# Patient Record
Sex: Male | Born: 1960
Health system: Southern US, Community
[De-identification: ages and names within clinical notes are randomized; demographics above are authoritative.]

## PROBLEM LIST (undated history)

## (undated) DIAGNOSIS — J45909 Unspecified asthma, uncomplicated: Secondary | ICD-10-CM

## (undated) HISTORY — PX: HIP SURGERY: SHX245

---

## 1998-12-25 ENCOUNTER — Emergency Department (HOSPITAL_COMMUNITY): Admission: EM | Admit: 1998-12-25 | Discharge: 1998-12-25 | Payer: Self-pay | Admitting: Emergency Medicine

## 1999-07-24 ENCOUNTER — Emergency Department (HOSPITAL_COMMUNITY): Admission: EM | Admit: 1999-07-24 | Discharge: 1999-07-24 | Payer: Self-pay | Admitting: *Deleted

## 1999-07-25 ENCOUNTER — Encounter: Payer: Self-pay | Admitting: Emergency Medicine

## 2000-04-27 ENCOUNTER — Encounter: Payer: Self-pay | Admitting: Emergency Medicine

## 2000-04-27 ENCOUNTER — Encounter: Payer: Self-pay | Admitting: Orthopaedic Surgery

## 2000-04-27 ENCOUNTER — Inpatient Hospital Stay (HOSPITAL_COMMUNITY): Admission: EM | Admit: 2000-04-27 | Discharge: 2000-05-06 | Payer: Self-pay | Admitting: Emergency Medicine

## 2000-04-28 ENCOUNTER — Encounter: Payer: Self-pay | Admitting: Orthopedic Surgery

## 2000-04-29 ENCOUNTER — Encounter: Payer: Self-pay | Admitting: Orthopedic Surgery

## 2000-05-02 ENCOUNTER — Encounter: Payer: Self-pay | Admitting: Orthopedic Surgery

## 2000-05-04 ENCOUNTER — Encounter: Payer: Self-pay | Admitting: Orthopedic Surgery

## 2000-05-09 ENCOUNTER — Emergency Department (HOSPITAL_COMMUNITY): Admission: EM | Admit: 2000-05-09 | Discharge: 2000-05-09 | Payer: Self-pay | Admitting: Emergency Medicine

## 2001-12-17 ENCOUNTER — Encounter: Payer: Self-pay | Admitting: *Deleted

## 2001-12-17 ENCOUNTER — Emergency Department (HOSPITAL_COMMUNITY): Admission: EM | Admit: 2001-12-17 | Discharge: 2001-12-17 | Payer: Self-pay | Admitting: *Deleted

## 2002-01-19 ENCOUNTER — Emergency Department (HOSPITAL_COMMUNITY): Admission: EM | Admit: 2002-01-19 | Discharge: 2002-01-19 | Payer: Self-pay | Admitting: Emergency Medicine

## 2002-01-19 ENCOUNTER — Ambulatory Visit (HOSPITAL_COMMUNITY): Admission: RE | Admit: 2002-01-19 | Discharge: 2002-01-19 | Payer: Self-pay | Admitting: Emergency Medicine

## 2002-01-19 ENCOUNTER — Encounter: Payer: Self-pay | Admitting: Emergency Medicine

## 2002-01-23 ENCOUNTER — Emergency Department (HOSPITAL_COMMUNITY): Admission: EM | Admit: 2002-01-23 | Discharge: 2002-01-23 | Payer: Self-pay | Admitting: *Deleted

## 2002-04-11 ENCOUNTER — Encounter: Payer: Self-pay | Admitting: Emergency Medicine

## 2002-04-11 ENCOUNTER — Emergency Department (HOSPITAL_COMMUNITY): Admission: EM | Admit: 2002-04-11 | Discharge: 2002-04-12 | Payer: Self-pay | Admitting: Emergency Medicine

## 2002-08-14 ENCOUNTER — Emergency Department (HOSPITAL_COMMUNITY): Admission: EM | Admit: 2002-08-14 | Discharge: 2002-08-15 | Payer: Self-pay | Admitting: Emergency Medicine

## 2002-08-15 ENCOUNTER — Encounter: Payer: Self-pay | Admitting: Emergency Medicine

## 2002-08-25 ENCOUNTER — Ambulatory Visit (HOSPITAL_COMMUNITY): Admission: RE | Admit: 2002-08-25 | Discharge: 2002-08-25 | Payer: Self-pay | Admitting: Internal Medicine

## 2003-06-28 ENCOUNTER — Emergency Department (HOSPITAL_COMMUNITY): Admission: EM | Admit: 2003-06-28 | Discharge: 2003-06-28 | Payer: Self-pay | Admitting: Emergency Medicine

## 2003-07-01 ENCOUNTER — Emergency Department (HOSPITAL_COMMUNITY): Admission: EM | Admit: 2003-07-01 | Discharge: 2003-07-02 | Payer: Self-pay | Admitting: Emergency Medicine

## 2005-07-18 ENCOUNTER — Emergency Department (HOSPITAL_COMMUNITY): Admission: EM | Admit: 2005-07-18 | Discharge: 2005-07-18 | Payer: Self-pay | Admitting: Emergency Medicine

## 2008-08-28 ENCOUNTER — Observation Stay (HOSPITAL_COMMUNITY): Admission: AC | Admit: 2008-08-28 | Discharge: 2008-08-29 | Payer: Self-pay

## 2009-01-10 ENCOUNTER — Emergency Department (HOSPITAL_COMMUNITY): Admission: EM | Admit: 2009-01-10 | Discharge: 2009-01-10 | Payer: Self-pay | Admitting: Emergency Medicine

## 2009-10-03 ENCOUNTER — Emergency Department (HOSPITAL_COMMUNITY): Admission: EM | Admit: 2009-10-03 | Discharge: 2009-10-03 | Payer: Self-pay | Admitting: Emergency Medicine

## 2009-10-06 ENCOUNTER — Emergency Department (HOSPITAL_COMMUNITY): Admission: EM | Admit: 2009-10-06 | Discharge: 2009-10-06 | Payer: Self-pay | Admitting: Emergency Medicine

## 2010-06-11 ENCOUNTER — Emergency Department (HOSPITAL_COMMUNITY)
Admission: EM | Admit: 2010-06-11 | Discharge: 2010-06-11 | Payer: Medicare Other | Source: Home / Self Care | Admitting: Family Medicine

## 2010-06-18 ENCOUNTER — Emergency Department (HOSPITAL_COMMUNITY)
Admission: EM | Admit: 2010-06-18 | Discharge: 2010-06-18 | Payer: Self-pay | Source: Home / Self Care | Admitting: Emergency Medicine

## 2010-06-28 ENCOUNTER — Emergency Department (HOSPITAL_COMMUNITY): Payer: Medicare Other

## 2010-06-28 ENCOUNTER — Emergency Department (HOSPITAL_COMMUNITY)
Admission: EM | Admit: 2010-06-28 | Discharge: 2010-06-28 | Disposition: A | Discharge status: Home / Self Care | Payer: Medicare Other | Attending: Emergency Medicine | Admitting: Emergency Medicine

## 2010-06-28 DIAGNOSIS — M79609 Pain in unspecified limb: Secondary | ICD-10-CM | POA: Insufficient documentation

## 2010-06-28 DIAGNOSIS — W268XXA Contact with other sharp object(s), not elsewhere classified, initial encounter: Secondary | ICD-10-CM | POA: Insufficient documentation

## 2010-06-28 DIAGNOSIS — M7989 Other specified soft tissue disorders: Secondary | ICD-10-CM | POA: Insufficient documentation

## 2010-06-28 DIAGNOSIS — S91309A Unspecified open wound, unspecified foot, initial encounter: Secondary | ICD-10-CM | POA: Insufficient documentation

## 2010-06-28 DIAGNOSIS — Y929 Unspecified place or not applicable: Secondary | ICD-10-CM | POA: Insufficient documentation

## 2010-09-05 LAB — CBC
Hemoglobin: 13.7 g/dL (ref 13.0–17.0)
RBC: 4.65 MIL/uL (ref 4.22–5.81)
RDW: 13.6 % (ref 11.5–15.5)

## 2010-09-05 LAB — POCT I-STAT, CHEM 8
BUN: 13 mg/dL (ref 6–23)
Creatinine, Ser: 1.6 mg/dL — ABNORMAL HIGH (ref 0.4–1.5)
Glucose, Bld: 125 mg/dL — ABNORMAL HIGH (ref 70–99)
HCT: 43 % (ref 39.0–52.0)
Hemoglobin: 14.6 g/dL (ref 13.0–17.0)
Sodium: 141 meq/L (ref 135–145)
TCO2: 23 mmol/L (ref 0–100)

## 2010-09-05 LAB — SAMPLE TO BLOOD BANK

## 2010-09-05 LAB — PROTIME-INR: Prothrombin Time: 12.5 seconds (ref 11.6–15.2)
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
## 2010-11-09 ENCOUNTER — Emergency Department (HOSPITAL_COMMUNITY): Payer: Medicare Other

## 2010-11-09 ENCOUNTER — Emergency Department (HOSPITAL_COMMUNITY)
Admission: EM | Admit: 2010-11-09 | Discharge: 2010-11-09 | Disposition: A | Discharge status: Home / Self Care | Payer: Medicare Other | Attending: Emergency Medicine | Admitting: Emergency Medicine

## 2010-11-09 DIAGNOSIS — M25559 Pain in unspecified hip: Secondary | ICD-10-CM | POA: Insufficient documentation

## 2010-11-09 DIAGNOSIS — J45909 Unspecified asthma, uncomplicated: Secondary | ICD-10-CM | POA: Insufficient documentation

## 2010-11-09 DIAGNOSIS — W19XXXA Unspecified fall, initial encounter: Secondary | ICD-10-CM | POA: Insufficient documentation

## 2010-11-09 DIAGNOSIS — M25569 Pain in unspecified knee: Secondary | ICD-10-CM | POA: Insufficient documentation

## 2011-09-05 ENCOUNTER — Emergency Department (HOSPITAL_COMMUNITY)
Admission: EM | Admit: 2011-09-05 | Discharge: 2011-09-05 | Disposition: A | Discharge status: Home / Self Care | Payer: Medicare Other | Attending: Emergency Medicine | Admitting: Emergency Medicine

## 2011-09-05 ENCOUNTER — Encounter (HOSPITAL_COMMUNITY): Payer: Self-pay | Admitting: Nurse Practitioner

## 2011-09-05 DIAGNOSIS — L02619 Cutaneous abscess of unspecified foot: Secondary | ICD-10-CM | POA: Insufficient documentation

## 2011-09-05 DIAGNOSIS — L03119 Cellulitis of unspecified part of limb: Secondary | ICD-10-CM

## 2011-09-05 MED ORDER — CEPHALEXIN 500 MG PO CAPS: 500.0000 mg | ORAL_CAPSULE | Freq: Four times a day (QID) | ORAL | Status: AC

## 2011-09-05 NOTE — ED Provider Notes (Signed)
History     CSN: [id number]  Arrival date & time 09/05/11  8657   First MD Initiated Contact with Patient 09/05/11 1006      Chief Complaint  Patient presents with  . Foot Pain    (Consider location/radiation/quality/duration/timing/severity/associated sxs/prior treatment) Patient is a 51 y.o. male presenting with lower extremity pain. The history is provided by the patient.  Foot Pain This is a new problem. The current episode started 2 days ago. The problem occurs constantly. The problem has been gradually worsening. The symptoms are aggravated by walking and standing. The symptoms are relieved by nothing. He has tried nothing for the symptoms.    History reviewed. No pertinent past medical history.  Past Surgical History  Procedure Date  . Hip surgery     History reviewed. No pertinent family history.  History  Substance Use Topics  . Smoking status: Never Smoker   . Smokeless tobacco: Not on file  . Alcohol Use: Yes     social      Review of Systems  All other systems reviewed and are negative.    Allergies  Review of patient's allergies indicates no known allergies.  Home Medications  No current outpatient prescriptions on file.  BP 143/91  Pulse 66  Temp(Src) 97.4 F (36.3 C) (Oral)  Resp 18  Ht 5\' 3"  (1.6 m)  Wt 260 lb (117.935 kg)  BMI 46.06 kg/m2  SpO2 98%  Physical Exam  Nursing note and vitals reviewed. Constitutional: He is oriented to person, place, and time. He appears well-developed and well-nourished. No distress.  HENT:  Head: Normocephalic and atraumatic.  Neck: Normal range of motion. Neck supple.  Musculoskeletal: Normal range of motion.  Neurological: He is alert and oriented to person, place, and time.  Skin: Skin is warm and dry. He is not diaphoretic.       There is swelling, warmth, and erythema to the dorsum of the left foot.  No ankle edema or calf ttp.  Homan's sign is absent.    ED Course  Procedures (including  critical care time)  Labs Reviewed - No data to display No results found.   No diagnosis found.    MDM  Clearly cellulitis.  No concern for dvt.        Geoffery Lyons, MD 09/05/11 1014

## 2011-09-05 NOTE — ED Notes (Signed)
C/o L foot pain and swelling over past couple days. Denies any injuries. Cms intact

## 2011-09-05 NOTE — Discharge Instructions (Signed)

## 2012-06-09 ENCOUNTER — Emergency Department (HOSPITAL_COMMUNITY): Payer: Medicare Other

## 2012-06-09 ENCOUNTER — Encounter (HOSPITAL_COMMUNITY): Payer: Self-pay | Admitting: *Deleted

## 2012-06-09 ENCOUNTER — Emergency Department (HOSPITAL_COMMUNITY)
Admission: EM | Admit: 2012-06-09 | Discharge: 2012-06-09 | Disposition: A | Discharge status: Home / Self Care | Payer: Medicare Other | Attending: Emergency Medicine | Admitting: Emergency Medicine

## 2012-06-09 DIAGNOSIS — Z9889 Other specified postprocedural states: Secondary | ICD-10-CM | POA: Insufficient documentation

## 2012-06-09 DIAGNOSIS — S4980XA Other specified injuries of shoulder and upper arm, unspecified arm, initial encounter: Secondary | ICD-10-CM | POA: Insufficient documentation

## 2012-06-09 DIAGNOSIS — Y929 Unspecified place or not applicable: Secondary | ICD-10-CM | POA: Insufficient documentation

## 2012-06-09 DIAGNOSIS — S79929A Unspecified injury of unspecified thigh, initial encounter: Secondary | ICD-10-CM | POA: Insufficient documentation

## 2012-06-09 DIAGNOSIS — M25551 Pain in right hip: Secondary | ICD-10-CM

## 2012-06-09 DIAGNOSIS — S79919A Unspecified injury of unspecified hip, initial encounter: Secondary | ICD-10-CM | POA: Insufficient documentation

## 2012-06-09 DIAGNOSIS — Z79899 Other long term (current) drug therapy: Secondary | ICD-10-CM | POA: Insufficient documentation

## 2012-06-09 DIAGNOSIS — S46909A Unspecified injury of unspecified muscle, fascia and tendon at shoulder and upper arm level, unspecified arm, initial encounter: Secondary | ICD-10-CM | POA: Insufficient documentation

## 2012-06-09 DIAGNOSIS — W19XXXA Unspecified fall, initial encounter: Secondary | ICD-10-CM

## 2012-06-09 DIAGNOSIS — M25511 Pain in right shoulder: Secondary | ICD-10-CM

## 2012-06-09 DIAGNOSIS — Y9389 Activity, other specified: Secondary | ICD-10-CM | POA: Insufficient documentation

## 2012-06-09 DIAGNOSIS — W010XXA Fall on same level from slipping, tripping and stumbling without subsequent striking against object, initial encounter: Secondary | ICD-10-CM | POA: Insufficient documentation

## 2012-06-09 MED ORDER — NAPROXEN 500 MG PO TABS: 500.0000 mg | ORAL_TABLET | Freq: Two times a day (BID) | ORAL | Status: DC

## 2012-06-09 MED ORDER — CYCLOBENZAPRINE HCL 10 MG PO TABS: 10.0000 mg | ORAL_TABLET | Freq: Two times a day (BID) | ORAL | Status: DC | PRN

## 2012-06-09 MED ORDER — OXYCODONE-ACETAMINOPHEN 5-325 MG PO TABS: 1.0000 | ORAL_TABLET | ORAL | Status: DC | PRN

## 2012-06-09 MED ORDER — OXYCODONE-ACETAMINOPHEN 5-325 MG PO TABS
1.0000 | ORAL_TABLET | Freq: Once | ORAL | Status: AC
  Administered 2012-06-09: 1 via ORAL
  Filled 2012-06-09: qty 1

## 2012-06-09 NOTE — ED Provider Notes (Signed)
History     CSN: [id number]  Arrival date & time 06/09/12  0917   First MD Initiated Contact with Patient 06/09/12 1201      Chief Complaint  Patient presents with  . Shoulder Pain    right  . Fall    (Consider location/radiation/quality/duration/timing/severity/associated sxs/prior treatment) HPI [name] is a 52 y.o. male who presents with complaint of a fall yesterday. States tripped and landed onto right side. Reports pain to the right shoulder and right hip. Unable to move right arm. States hx of surgery to the right hip. Denies any other injuries. No head injury. Took a vicodin he had at home with no relief. States able to walk, but with pain. No numbness or weakness of extremities.  No other complaints.    History reviewed. No pertinent past medical history.  Past Surgical History  Procedure Date  . Hip surgery     History reviewed. No pertinent family history.  History  Substance Use Topics  . Smoking status: Never Smoker   . Smokeless tobacco: Never Used  . Alcohol Use: Yes     Comment: social      Review of Systems  Constitutional: Negative for fever and chills.  Respiratory: Negative.   Cardiovascular: Negative.   Gastrointestinal: Negative.   Musculoskeletal: Positive for myalgias, back pain, joint swelling and arthralgias.  Neurological: Negative for weakness and numbness.    Allergies  Review of patient's allergies indicates no known allergies.  Home Medications   Current Outpatient Rx  Name  Route  Sig  Dispense  Refill  . HYDROCODONE-ACETAMINOPHEN 5-500 MG PO TABS   Oral   Take 1 tablet by mouth every 6 (six) hours as needed. Pain         . CYCLOBENZAPRINE HCL 10 MG PO TABS   Oral   Take 1 tablet (10 mg total) by mouth 2 (two) times daily as needed for muscle spasms.   20 tablet   0   . NAPROXEN 500 MG PO TABS   Oral   Take 1 tablet (500 mg total) by mouth 2 (two) times daily.   30 tablet   0   .  OXYCODONE-ACETAMINOPHEN 5-325 MG PO TABS   Oral   Take 1 tablet by mouth every 4 (four) hours as needed for pain.   20 tablet   0     BP 138/74  Physical Exam  Nursing note and vitals reviewed. Constitutional: He is oriented to person, place, and time. He appears well-developed and well-nourished.  Eyes: Conjunctivae normal are normal.  Neck: Neck supple.  Cardiovascular: Normal rate, regular rhythm and normal heart sounds.   Pulmonary/Chest: Effort normal and breath sounds normal. No respiratory distress. He has no wheezes. He has no rales.  Abdominal: Soft. Bowel sounds are normal. He exhibits no distension. There is no tenderness. There is no rebound.  Musculoskeletal: He exhibits no edema.       Right shoulder normal appearing. Pain with any ROM. Tender over anterior superior and posterior joint. Radial pulses normal. Elbow normal. Right hip normal appearing. Pain with ROM  Of the hip, with flexion, internal and external rotation. Normal knee exam.   Neurological: He is alert and oriented to person, place, and time.    ED Course  Procedures (including critical care time)  Labs Reviewed - No data to display Dg Shoulder Right  06/09/2012  *RADIOLOGY REPORT*  Clinical Data: Shoulder pain, fall.  RIGHT SHOULDER - 2+ VIEW  Comparison: None.  Findings: Mild degenerative changes in the right AC joint. Glenohumeral joint is unremarkable. No acute bony abnormality. Specifically, no fracture, subluxation, or dislocation.  Soft tissues are intact.  IMPRESSION: No acute bony abnormality.   Original Report Authenticated By: Charlett Nose, M.D.    Dg Hip Complete Right  06/09/2012  *RADIOLOGY REPORT*  Clinical Data: Fall, right-sided pain.  RIGHT HIP - COMPLETE 2+ VIEW  Comparison: 11/09/2010  Findings: Postsurgical changes in the right acetabulum / hemipelvis.  Degenerative changes in the hips bilaterally with joint space narrowing and spurring, right greater than left.  No acute fracture,  subluxation or dislocation.  No change since prior study.  IMPRESSION: Postoperative changes on the right.  Degenerative changes in the hips bilaterally, right greater than left.  No acute findings.   Original Report Authenticated By: Charlett Nose, M.D.      1. Right shoulder pain   2. Right hip pain   3. Fall       MDM  Pt after mechanical fall yesterday. He is neurovascularly intact. His x-rays are negative. He was given a sling for right shoulder pain. Treated with percocet in ED. D/c home with pain medications and follow up with his orthopedist who is Dr. Lajoyce Corners.          Lottie Mussel, PA 06/09/12 1629  Lottie Mussel, PA 06/13/12 1557

## 2012-06-09 NOTE — ED Notes (Signed)
Patient transported to X-ray 

## 2012-06-09 NOTE — ED Notes (Signed)
Pt brought in by [facility] with reports of right shoulder pain that has worsened since falling yesterday.

## 2012-06-13 NOTE — ED Provider Notes (Signed)
Medical screening examination/treatment/procedure(s) were performed by non-physician practitioner and as supervising physician I was immediately available for consultation/collaboration.  Louay Myrie R. Yitzchak Kothari, MD 06/13/12 2313 

## 2012-07-21 ENCOUNTER — Emergency Department (HOSPITAL_COMMUNITY): Payer: Medicare Other

## 2012-07-21 ENCOUNTER — Encounter (HOSPITAL_COMMUNITY): Payer: Self-pay | Admitting: *Deleted

## 2012-07-21 ENCOUNTER — Emergency Department (HOSPITAL_COMMUNITY)
Admission: EM | Admit: 2012-07-21 | Discharge: 2012-07-21 | Disposition: A | Discharge status: Home / Self Care | Payer: Medicare Other | Attending: Emergency Medicine | Admitting: Emergency Medicine

## 2012-07-21 DIAGNOSIS — S79919A Unspecified injury of unspecified hip, initial encounter: Secondary | ICD-10-CM | POA: Insufficient documentation

## 2012-07-21 DIAGNOSIS — S7000XA Contusion of unspecified hip, initial encounter: Secondary | ICD-10-CM | POA: Insufficient documentation

## 2012-07-21 DIAGNOSIS — J45909 Unspecified asthma, uncomplicated: Secondary | ICD-10-CM | POA: Insufficient documentation

## 2012-07-21 DIAGNOSIS — Y9389 Activity, other specified: Secondary | ICD-10-CM | POA: Insufficient documentation

## 2012-07-21 DIAGNOSIS — Y92410 Unspecified street and highway as the place of occurrence of the external cause: Secondary | ICD-10-CM | POA: Insufficient documentation

## 2012-07-21 DIAGNOSIS — T148XXA Other injury of unspecified body region, initial encounter: Secondary | ICD-10-CM

## 2012-07-21 HISTORY — DX: Unspecified asthma, uncomplicated: J45.909

## 2012-07-21 MED ORDER — OXYCODONE-ACETAMINOPHEN 5-325 MG PO TABS: 1.0000 | ORAL_TABLET | Freq: Four times a day (QID) | ORAL | Status: DC | PRN

## 2012-07-21 MED ORDER — OXYCODONE-ACETAMINOPHEN 5-325 MG PO TABS
1.0000 | ORAL_TABLET | Freq: Once | ORAL | Status: AC
  Administered 2012-07-21: 1 via ORAL
  Filled 2012-07-21: qty 1

## 2012-07-21 NOTE — ED Notes (Signed)
Attempted to ambulate pt.  Pt st's he's unable to get up; gave pt percocet and ice pack, will re-attempt shortly.
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
## 2012-08-14 ENCOUNTER — Emergency Department (HOSPITAL_COMMUNITY)
Admission: EM | Admit: 2012-08-14 | Discharge: 2012-08-14 | Disposition: A | Discharge status: Home / Self Care | Payer: No Typology Code available for payment source | Attending: Emergency Medicine | Admitting: Emergency Medicine

## 2012-08-14 ENCOUNTER — Encounter (HOSPITAL_COMMUNITY): Payer: Self-pay | Admitting: Emergency Medicine

## 2012-08-14 DIAGNOSIS — Y9301 Activity, walking, marching and hiking: Secondary | ICD-10-CM | POA: Insufficient documentation

## 2012-08-14 DIAGNOSIS — J45909 Unspecified asthma, uncomplicated: Secondary | ICD-10-CM | POA: Insufficient documentation

## 2012-08-14 DIAGNOSIS — S79919A Unspecified injury of unspecified hip, initial encounter: Secondary | ICD-10-CM | POA: Diagnosis present

## 2012-08-14 DIAGNOSIS — S79921A Unspecified injury of right thigh, initial encounter: Secondary | ICD-10-CM

## 2012-08-14 DIAGNOSIS — Y92410 Unspecified street and highway as the place of occurrence of the external cause: Secondary | ICD-10-CM | POA: Insufficient documentation

## 2012-08-14 MED ORDER — ACETAMINOPHEN 500 MG PO TABS: 500.0000 mg | ORAL_TABLET | Freq: Four times a day (QID) | ORAL | Status: DC | PRN

## 2012-08-14 MED ORDER — ACETAMINOPHEN 325 MG PO TABS
650.0000 mg | ORAL_TABLET | Freq: Once | ORAL | Status: AC
  Administered 2012-08-14: 650 mg via ORAL
  Filled 2012-08-14: qty 2

## 2012-08-14 NOTE — ED Provider Notes (Signed)
Medical screening examination/treatment/procedure(s) were performed by non-physician practitioner and as supervising physician I was immediately available for consultation/collaboration.  Merlina Marchena M Chyrel Taha, MD 08/14/12 0746 
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
## 2012-11-12 ENCOUNTER — Ambulatory Visit: Payer: Medicare Other | Attending: Orthopedic Surgery | Admitting: Physical Therapy

## 2012-11-12 DIAGNOSIS — R262 Difficulty in walking, not elsewhere classified: Secondary | ICD-10-CM | POA: Insufficient documentation

## 2012-11-12 DIAGNOSIS — M25559 Pain in unspecified hip: Secondary | ICD-10-CM | POA: Insufficient documentation

## 2012-11-12 DIAGNOSIS — IMO0001 Reserved for inherently not codable concepts without codable children: Secondary | ICD-10-CM | POA: Insufficient documentation

## 2012-11-12 DIAGNOSIS — R269 Unspecified abnormalities of gait and mobility: Secondary | ICD-10-CM | POA: Insufficient documentation

## 2012-11-12 DIAGNOSIS — R609 Edema, unspecified: Secondary | ICD-10-CM | POA: Insufficient documentation

## 2012-11-24 ENCOUNTER — Ambulatory Visit: Payer: Medicare Other | Attending: Orthopedic Surgery | Admitting: Physical Therapy

## 2012-11-24 DIAGNOSIS — R609 Edema, unspecified: Secondary | ICD-10-CM | POA: Insufficient documentation

## 2012-11-24 DIAGNOSIS — IMO0001 Reserved for inherently not codable concepts without codable children: Secondary | ICD-10-CM | POA: Insufficient documentation

## 2012-11-24 DIAGNOSIS — M25559 Pain in unspecified hip: Secondary | ICD-10-CM | POA: Insufficient documentation

## 2012-11-24 DIAGNOSIS — R269 Unspecified abnormalities of gait and mobility: Secondary | ICD-10-CM | POA: Insufficient documentation

## 2012-11-24 DIAGNOSIS — R262 Difficulty in walking, not elsewhere classified: Secondary | ICD-10-CM | POA: Insufficient documentation

## 2012-11-26 ENCOUNTER — Ambulatory Visit: Payer: Medicare Other | Admitting: Physical Therapy

## 2012-12-01 ENCOUNTER — Ambulatory Visit: Payer: Medicare Other | Admitting: Physical Therapy

## 2012-12-03 ENCOUNTER — Ambulatory Visit: Payer: Medicare Other | Admitting: Physical Therapy

## 2012-12-08 ENCOUNTER — Ambulatory Visit: Payer: Medicare Other | Admitting: Physical Therapy

## 2012-12-14 ENCOUNTER — Ambulatory Visit: Payer: Medicare Other | Admitting: Physical Therapy

## 2012-12-17 ENCOUNTER — Ambulatory Visit: Payer: Medicare Other | Admitting: Physical Therapy

## 2012-12-23 ENCOUNTER — Ambulatory Visit: Payer: Medicare Other | Admitting: Physical Therapy

## 2012-12-29 ENCOUNTER — Ambulatory Visit: Payer: Medicare Other | Attending: Orthopedic Surgery | Admitting: Physical Therapy

## 2012-12-29 DIAGNOSIS — R269 Unspecified abnormalities of gait and mobility: Secondary | ICD-10-CM | POA: Insufficient documentation

## 2012-12-29 DIAGNOSIS — IMO0001 Reserved for inherently not codable concepts without codable children: Secondary | ICD-10-CM | POA: Insufficient documentation

## 2012-12-29 DIAGNOSIS — R609 Edema, unspecified: Secondary | ICD-10-CM | POA: Insufficient documentation

## 2012-12-29 DIAGNOSIS — M25559 Pain in unspecified hip: Secondary | ICD-10-CM | POA: Insufficient documentation

## 2012-12-29 DIAGNOSIS — R262 Difficulty in walking, not elsewhere classified: Secondary | ICD-10-CM | POA: Insufficient documentation

## 2012-12-31 ENCOUNTER — Ambulatory Visit: Payer: Medicare Other | Admitting: Physical Therapy

## 2013-01-04 ENCOUNTER — Ambulatory Visit: Payer: Medicare Other | Admitting: Physical Therapy

## 2013-01-05 ENCOUNTER — Ambulatory Visit: Payer: Medicare Other | Admitting: Physical Therapy

## 2013-01-05 ENCOUNTER — Encounter: Payer: Medicare Other | Admitting: Physical Therapy

## 2013-01-06 ENCOUNTER — Ambulatory Visit: Payer: Medicare Other | Admitting: Physical Therapy

## 2014-08-09 ENCOUNTER — Emergency Department (INDEPENDENT_AMBULATORY_CARE_PROVIDER_SITE_OTHER): Payer: Medicare Other

## 2014-08-09 ENCOUNTER — Encounter (HOSPITAL_COMMUNITY): Payer: Self-pay | Admitting: Emergency Medicine

## 2014-08-09 ENCOUNTER — Emergency Department (INDEPENDENT_AMBULATORY_CARE_PROVIDER_SITE_OTHER)
Admission: EM | Admit: 2014-08-09 | Discharge: 2014-08-09 | Disposition: A | Discharge status: Home / Self Care | Payer: Medicare Other | Source: Home / Self Care | Attending: Family Medicine | Admitting: Family Medicine

## 2014-08-09 DIAGNOSIS — J111 Influenza due to unidentified influenza virus with other respiratory manifestations: Secondary | ICD-10-CM

## 2014-08-09 MED ORDER — METHYLPREDNISOLONE ACETATE 80 MG/ML IJ SUSP
INTRAMUSCULAR | Status: AC
  Filled 2014-08-09: qty 1

## 2014-08-09 MED ORDER — LEVOFLOXACIN 500 MG PO TABS
500.0000 mg | ORAL_TABLET | Freq: Every day | ORAL | Status: DC
Start: 2014-08-09 — End: 2017-02-25

## 2014-08-09 MED ORDER — METHYLPREDNISOLONE ACETATE 80 MG/ML IJ SUSP
80.0000 mg | Freq: Once | INTRAMUSCULAR | Status: AC
  Administered 2014-08-09: 80 mg via INTRAMUSCULAR

## 2014-08-09 MED ORDER — HYDROCOD POLST-CHLORPHEN POLST 10-8 MG/5ML PO LQCR: 5.0000 mL | Freq: Two times a day (BID) | ORAL | Status: DC | PRN

## 2014-08-09 NOTE — ED Provider Notes (Signed)
CSN: [id number]     Arrival date & time 08/09/14  1815 History   First MD Initiated Contact with Patient 08/09/14 2001     Chief Complaint  Patient presents with  . Cough   (Consider location/radiation/quality/duration/timing/severity/associated sxs/prior Treatment) Patient is a 54 y.o. male presenting with cough. The history is provided by the patient.  Cough Cough characteristics:  Productive Sputum characteristics:  Green, clear and bloody Severity:  Moderate Onset quality:  Gradual Duration:  1 week Chronicity:  New Smoker: no   Context: upper respiratory infection   Context comment[facility] of [facility]x. Relieved by:  None tried Worsened by:  Nothing tried Ineffective treatments:  Decongestant Associated symptoms: rhinorrhea and shortness of breath   Associated symptoms: no fever and no wheezing     Past Medical History  Diagnosis Date  . Asthma    Past Surgical History  Procedure Laterality Date  . Hip surgery     No family history on file. History  Substance Use Topics  . Smoking status: Never Smoker   . Smokeless tobacco: Never Used  . Alcohol Use: Yes     Comment: social    Review of Systems  Constitutional: Negative.  Negative for fever.  HENT: Positive for congestion and rhinorrhea.   Respiratory: Positive for cough and shortness of breath. Negative for wheezing.   Cardiovascular: Negative.   Gastrointestinal: Negative.     Allergies  Aspirin and Motrin  Home Medications   Prior to Admission medications   Medication Sig Start Date End Date Taking? Authorizing Provider  acetaminophen (TYLENOL) 500 MG tablet Take 1 tablet (500 mg total) by mouth every 6 (six) hours as needed for pain. 08/14/12   Fayrene HelperBowie Tran, PA-C  chlorpheniramine-HYDROcodone (TUSSIONEX PENNKINETIC ER) 10-8 MG/5ML LQCR Take 5 mLs by mouth every 12 (twelve) hours as needed for cough. 08/09/14   Linna HoffJames D Kindl, MD  levofloxacin (LEVAQUIN) 500 MG tablet Take 1 tablet  (500 mg total) by mouth daily. 08/09/14   Linna HoffJames D Kindl, MD  oxyCODONE-acetaminophen (PERCOCET/ROXICET) 5-325 MG per tablet Take 1 tablet by mouth every 6 (six) hours as needed for pain. 07/21/12   Sunnie NielsenBrian Opitz, MD   BP 165/100 mmHg  Pulse 90  Temp(Src) 98.4 F (36.9 C) (Oral)  Resp 20  SpO2 98% Physical Exam  Constitutional: He is oriented to person, place, and time. He appears well-developed and well-nourished. No distress.  HENT:  Mouth/Throat: Oropharynx is clear and moist.  Eyes: Pupils are equal, round, and reactive to light.  Neck: Normal range of motion. Neck supple.  Cardiovascular: Normal heart sounds and intact distal pulses.   Pulmonary/Chest: Effort normal. He has decreased breath sounds. He has no wheezes. He has rhonchi. He has no rales.  Musculoskeletal: He exhibits no edema.  Lymphadenopathy:    He has no cervical adenopathy.  Neurological: He is alert and oriented to person, place, and time.  Skin: Skin is warm and dry.  Nursing note and vitals reviewed.   ED Course  Procedures (including critical care time) Labs Review Labs Reviewed - No data to display  Imaging Review Dg Chest 2 View  08/09/2014   CLINICAL DATA:  Productive cough  EXAM: CHEST  2 VIEW  COMPARISON:  06/18/2010  FINDINGS: There is moderate unchanged cardiomegaly. The lungs are clear. There are no pleural effusions. Pulmonary vasculature is normal.  IMPRESSION: Cardiomegaly.  No acute findings.   Electronically Signed   By: Rosey Bathaniel R Mitchell M.D.  On: 08/09/2014 21:13   X-rays reviewed and report per radiologist.   MDM   1. Bronchitis with influenza        Linna Hoff, MD 08/09/14 2141

## 2014-08-09 NOTE — ED Notes (Signed)
Cough and cold x 1 week ago.  Coughing up green phlegm, symptoms x 1 week

## 2015-03-13 ENCOUNTER — Emergency Department (HOSPITAL_COMMUNITY): Payer: Medicare Other

## 2015-03-13 ENCOUNTER — Emergency Department (HOSPITAL_COMMUNITY)
Admission: EM | Admit: 2015-03-13 | Discharge: 2015-03-13 | Disposition: A | Discharge status: Home / Self Care | Payer: Medicare Other | Attending: Emergency Medicine | Admitting: Emergency Medicine

## 2015-03-13 DIAGNOSIS — S6292XS Unspecified fracture of left wrist and hand, sequela: Secondary | ICD-10-CM

## 2015-03-13 DIAGNOSIS — S01511S Laceration without foreign body of lip, sequela: Secondary | ICD-10-CM | POA: Diagnosis not present

## 2015-03-13 DIAGNOSIS — W19XXXS Unspecified fall, sequela: Secondary | ICD-10-CM

## 2015-03-13 DIAGNOSIS — S62396S Other fracture of fifth metacarpal bone, right hand, sequela: Secondary | ICD-10-CM | POA: Insufficient documentation

## 2015-03-13 DIAGNOSIS — W1839XS Other fall on same level, sequela: Secondary | ICD-10-CM | POA: Insufficient documentation

## 2015-03-13 DIAGNOSIS — J45909 Unspecified asthma, uncomplicated: Secondary | ICD-10-CM | POA: Diagnosis not present

## 2015-03-13 MED ORDER — OXYCODONE-ACETAMINOPHEN 5-325 MG PO TABS
1.0000 | ORAL_TABLET | Freq: Once | ORAL | Status: AC
  Administered 2015-03-13: 1 via ORAL
  Filled 2015-03-13: qty 1

## 2015-03-13 MED ORDER — CLINDAMYCIN HCL 150 MG PO CAPS: 150.0000 mg | ORAL_CAPSULE | Freq: Four times a day (QID) | ORAL | Status: DC

## 2015-03-13 NOTE — Progress Notes (Signed)
Orthopedic Tech Progress Note Patient Details:  Luis ReefRichard C Delange [birth year] [id number]  Patient ID: Luis Reefichard C Lack, male   DOB: [birth year], 54 y.o.   MRN: [id number] Ulna gutter to be applied to lue  Ramsay Bognar 03/13/2015, 1:41 PM

## 2015-03-13 NOTE — ED Notes (Signed)
Patient transported to CT and xray 
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
## 2015-03-13 NOTE — Progress Notes (Signed)
Orthopedic Tech Progress Note Patient Details:  Luis ReefRichard C Webster 1961-05-01 [id number]  Ortho Devices Type of Ortho Device: Ace wrap, Ulna gutter splint Ortho Device/Splint Location: rue Ortho Device/Splint Interventions: Application   [name] 03/13/2015, 12:56 PM

## 2015-03-13 NOTE — Discharge Instructions (Signed)
FOLLOW UP WITH DENTIST FOR FURTHER EVALUATION. TAKE ANTIBIOTICS AS PRESCRIBED. AVOID ALCOHOL CONSUMPTION. APPLY ICE TO AFFECTED AREA. TAKE HOME PAIN MEDICATION OR IBUPROFEN/TYLENOL FOR PAIN RELIEF.

## 2015-09-10 ENCOUNTER — Emergency Department (HOSPITAL_COMMUNITY)
Admission: EM | Admit: 2015-09-10 | Discharge: 2015-09-11 | Disposition: A | Discharge status: Home / Self Care | Payer: Medicare Other | Attending: Emergency Medicine | Admitting: Emergency Medicine

## 2015-09-10 DIAGNOSIS — J45909 Unspecified asthma, uncomplicated: Secondary | ICD-10-CM | POA: Insufficient documentation

## 2015-09-10 DIAGNOSIS — G8929 Other chronic pain: Secondary | ICD-10-CM | POA: Insufficient documentation

## 2015-09-10 DIAGNOSIS — M25551 Pain in right hip: Secondary | ICD-10-CM | POA: Diagnosis present

## 2015-09-10 DIAGNOSIS — Z792 Long term (current) use of antibiotics: Secondary | ICD-10-CM | POA: Diagnosis not present

## 2015-09-10 DIAGNOSIS — Z9889 Other specified postprocedural states: Secondary | ICD-10-CM | POA: Insufficient documentation

## 2015-09-11 ENCOUNTER — Encounter (HOSPITAL_COMMUNITY): Payer: Self-pay | Admitting: Emergency Medicine

## 2015-09-11 ENCOUNTER — Emergency Department (HOSPITAL_COMMUNITY): Payer: Medicare Other

## 2015-09-11 NOTE — ED Notes (Signed)
Pt verbalized understanding of discharge instructions and follow up care

## 2015-09-11 NOTE — ED Provider Notes (Signed)
CSN: [id number]     Arrival date & time 09/10/15  2350 History   First MD Initiated Contact with Patient 09/11/15 0003     Chief Complaint  Patient presents with  . Hip Pain     (Consider location/radiation/quality/duration/timing/severity/associated sxs/prior Treatment) HPI Comments: Patient presents to the emergency Department with chief complaint of right hip pain. He reports having chronic right hip pain. He has had surgery on his hip in the past, and has been told by his orthopedic doctor that the next surgery will be a total hip replacement.  He is trying to avoid this.  He is followed by pain management. Today he requests an x-ray of his hip to see if anything has changed. He denies any new injuries. He is ambulatory, but this causes increased pain.   The history is provided by the patient. No language interpreter was used.    Past Medical History  Diagnosis Date  . Asthma    Past Surgical History  Procedure Laterality Date  . Hip surgery     No family history on file. Social History  Substance Use Topics  . Smoking status: Never Smoker   . Smokeless tobacco: Never Used  . Alcohol Use: Yes     Comment: social    Review of Systems  Constitutional: Negative for fever and chills.  Respiratory: Negative for shortness of breath.   Cardiovascular: Negative for chest pain.  Gastrointestinal: Negative for nausea, vomiting, diarrhea and constipation.  Genitourinary: Negative for dysuria.  Musculoskeletal: Positive for arthralgias.      Allergies  Aspirin and Motrin  Home Medications   Prior to Admission medications   Medication Sig Start Date End Date Taking? Authorizing Provider  acetaminophen (TYLENOL) 500 MG tablet Take 1 tablet (500 mg total) by mouth every 6 (six) hours as needed for pain. Patient not taking: Reported on 03/13/2015 08/14/12   Fayrene HelperBowie Tran, PA-C  chlorpheniramine-HYDROcodone [facility](TUSSIONEX PENNKINETIC ER) 10-8 MG/5ML LQCR Take 5 mLs by mouth every 12  (twelve) hours as needed for cough. Patient not taking: Reported on 03/13/2015 08/09/14   Linna HoffJames D Kindl, MD  clindamycin (CLEOCIN) 150 MG capsule Take 1 capsule (150 mg total) by mouth every 6 (six) hours. 03/13/15   Samantha Tripp Dowless, PA-C  levofloxacin (LEVAQUIN) 500 MG tablet Take 1 tablet (500 mg total) by mouth daily. Patient not taking: Reported on 03/13/2015 08/09/14   Linna HoffJames D Kindl, MD  oxyCODONE-acetaminophen (PERCOCET) 10-325 MG tablet Take 1 tablet by mouth every 4 (four) hours as needed. Hip pain 02/10/15   Historical Provider, MD  oxyCODONE-acetaminophen (PERCOCET/ROXICET) 5-325 MG per tablet Take 1 tablet by mouth every 6 (six) hours as needed for pain. Patient not taking: Reported on 03/13/2015 07/21/12   Sunnie NielsenBrian Opitz, MD   BP 114/79 mmHg  Pulse 88  Temp(Src) 98 F (36.7 C) (Oral)  Resp 16  SpO2 94% Physical Exam Physical Exam  Constitutional: Pt appears well-developed and well-nourished. No distress.  HENT:  Head: Normocephalic and atraumatic.  Eyes: Conjunctivae are normal.  Neck: Normal range of motion.  Cardiovascular: Normal rate, regular rhythm and intact distal pulses.   Capillary refill < 3 sec  Pulmonary/Chest: Effort normal and breath sounds normal.  Musculoskeletal: Pt exhibits tenderness at right hip. Pt exhibits no edema. Ambulates with antalgic gait ROM: 4/5  Neurological: Pt  is alert. Coordination normal.  Sensation 5/5 Strength 4/5  Skin: Skin is warm and dry. Pt is not diaphoretic.  No tenting of the skin  Psychiatric: Pt has  a normal mood and affect.  Nursing note and vitals reviewed.  ED Course  Procedures (including critical care time)  Imaging Review Dg Hip Unilat W Or W/o Pelvis Min 4 Views Right  09/11/2015  CLINICAL DATA:  Right hip pain, onset last week, not relieved by prescription pain medication. EXAM: DG HIP (WITH OR WITHOUT PELVIS) 4+V RIGHT COMPARISON:  None. FINDINGS: Postoperative changes with plate and screw fixation of on hold  acetabular fracture on the right. Degenerative changes in the right hip greater than the left hip. Right hip demonstrates near complete loss of joint space with bone-on-bone appearance. Prominent osteophytes on the acetabular and femoral surfaces. No evidence of acute fracture or dislocation. Soft tissues are unremarkable. IMPRESSION: Old postoperative changes in the right acetabulum with prominent degenerative changes in the right hip. No acute bony abnormalities. Electronically Signed   By: Burman Nieves M.D.   On: 09/11/2015 01:20   I have personally reviewed and evaluated these images and lab results as part of my medical decision-making.    MDM   Final diagnoses:  Chronic hip pain, right    Patient with chronic right hip pain.  Requests an x-ray of his hip to see if anything has changed. No new injuries. Will check plain films per request.  Plain films are as above. Discussed with patient that he might need to consider hip replacement surgery. Will have him make an appointment with Dr. Lajoyce Corners. Patient understands and agrees with plan. He is upset that he is not getting anything additional for pain.  Patient is under the care of pain management.  Will not refill or give additional narcotics per our pain policy.   Roxy Horseman, PA-C 09/11/15 0135  Laurence Spates, MD 09/11/15 [phone]

## 2015-09-11 NOTE — ED Notes (Signed)
Pt. reports right hip pain onset last week unrelieved by prescription pain medication , denies injury /ambulatory .

## 2015-09-11 NOTE — Discharge Instructions (Signed)

## 2016-02-24 ENCOUNTER — Emergency Department (HOSPITAL_COMMUNITY): Payer: Medicare Other

## 2016-02-24 ENCOUNTER — Encounter (HOSPITAL_COMMUNITY): Payer: Self-pay | Admitting: *Deleted

## 2016-02-24 ENCOUNTER — Emergency Department (HOSPITAL_COMMUNITY)
Admission: EM | Admit: 2016-02-24 | Discharge: 2016-02-24 | Disposition: A | Discharge status: Home / Self Care | Payer: Medicare Other | Attending: Emergency Medicine | Admitting: Emergency Medicine

## 2016-02-24 DIAGNOSIS — J45909 Unspecified asthma, uncomplicated: Secondary | ICD-10-CM | POA: Insufficient documentation

## 2016-02-24 DIAGNOSIS — Y929 Unspecified place or not applicable: Secondary | ICD-10-CM | POA: Diagnosis not present

## 2016-02-24 DIAGNOSIS — R51 Headache: Secondary | ICD-10-CM | POA: Insufficient documentation

## 2016-02-24 DIAGNOSIS — S0181XA Laceration without foreign body of other part of head, initial encounter: Secondary | ICD-10-CM | POA: Diagnosis not present

## 2016-02-24 DIAGNOSIS — S0993XA Unspecified injury of face, initial encounter: Secondary | ICD-10-CM | POA: Diagnosis present

## 2016-02-24 DIAGNOSIS — Y999 Unspecified external cause status: Secondary | ICD-10-CM | POA: Diagnosis not present

## 2016-02-24 DIAGNOSIS — Y939 Activity, unspecified: Secondary | ICD-10-CM | POA: Insufficient documentation

## 2016-02-24 DIAGNOSIS — I10 Essential (primary) hypertension: Secondary | ICD-10-CM | POA: Insufficient documentation

## 2016-02-24 LAB — COMPREHENSIVE METABOLIC PANEL
ALBUMIN: 4 g/dL (ref 3.5–5.0)
ALK PHOS: 54 U/L (ref 38–126)
ALT: 13 U/L — ABNORMAL LOW (ref 17–63)
ANION GAP: 8 (ref 5–15)
AST: 17 U/L (ref 15–41)
BILIRUBIN TOTAL: 0.5 mg/dL (ref 0.3–1.2)
BUN: 9 mg/dL (ref 6–20)
CALCIUM: 9.3 mg/dL (ref 8.9–10.3)
CO2: 24 mmol/L (ref 22–32)
Chloride: 108 mmol/L (ref 101–111)
Creatinine, Ser: 0.99 mg/dL (ref 0.61–1.24)
GFR calc Af Amer: >60 mL/min (ref >60)
GFR calc non Af Amer: >60 mL/min (ref >60)
GLUCOSE: 105 mg/dL — AB (ref 65–99)
Potassium: 3.4 mmol/L — ABNORMAL LOW (ref 3.5–5.1)
Sodium: 140 mmol/L (ref 135–145)
TOTAL PROTEIN: 7.1 g/dL (ref 6.5–8.1)

## 2016-02-24 LAB — CBC WITH DIFFERENTIAL/PLATELET
BASOS PCT: 1 %
Basophils Absolute: 0.1 10*3/uL (ref 0.0–0.1)
Eosinophils Absolute: 0.3 10*3/uL (ref 0.0–0.7)
Eosinophils Relative: 5 %
HEMATOCRIT: 38.4 % — AB (ref 39.0–52.0)
HEMOGLOBIN: 12.9 g/dL — AB (ref 13.0–17.0)
LYMPHS ABS: 3 10*3/uL (ref 0.7–4.0)
LYMPHS PCT: 42 %
MCH: 28.6 pg (ref 26.0–34.0)
MCHC: 33.6 g/dL (ref 30.0–36.0)
MCV: 85.1 fL (ref 78.0–100.0)
MONOS PCT: 9 %
Monocytes Absolute: 0.6 10*3/uL (ref 0.1–1.0)
NEUTROS ABS: 3.1 10*3/uL (ref 1.7–7.7)
NEUTROS PCT: 43 %
Platelets: 238 10*3/uL (ref 150–400)
RBC: 4.51 MIL/uL (ref 4.22–5.81)
RDW: 12.7 % (ref 11.5–15.5)
WBC: 7.1 10*3/uL (ref 4.0–10.5)

## 2016-02-24 LAB — ETHANOL: Alcohol, Ethyl (B): 253 mg/dL — ABNORMAL HIGH (ref <5)

## 2016-02-24 MED ORDER — LIDOCAINE-EPINEPHRINE 1 %-1:100000 IJ SOLN
10.0000 mL | Freq: Once | INTRAMUSCULAR | Status: AC
  Administered 2016-02-24: 1 mL via INTRADERMAL
  Filled 2016-02-24: qty 1

## 2016-02-24 NOTE — ED Notes (Signed)
Lying on stretcher w/eyes closed. Snorous respirations noted - O2 sats 88% on room air. O2 applied via n/c at 2L/min - sats 96%. Pt noted w/personal shirt and socks on - also hospital gown. No pants/shoes noted.

## 2016-02-24 NOTE — ED Provider Notes (Signed)
LACERATION REPAIR Performed by: Dorthula MatasGREENE,Blanca Carreon G Authorized by: Dorthula MatasGREENE,Kathryne Ramella G Consent: Verbal consent obtained. Risks and benefits: risks, benefits and alternatives were discussed Consent given by: patient Patient identity confirmed: provided demographic data Prepped and Draped in normal sterile fashion Wound explored  Laceration Location: forehead  Laceration Length: "L" shaped 1.5 cm  No Foreign Bodies seen or palpated  Anesthesia: local infiltration  Local anesthetic: lidocaine 2% with epinephrine  Anesthetic total: 2 ml  Irrigation method: syringe Amount of cleaning: standard  Skin closure: sutures  Number of sutures: 3  Technique: simple interrupted  Patient tolerance: Patient tolerated the procedure well with no immediate complications.  Patient seen by Dr. Madilyn Hookees, for HPI, ROS, PE and Dispo please refer to her note.   Luis Peliffany Yonathan Perrow, PA-C 02/24/16 [id number]    Tilden FossaElizabeth Rees, MD 02/24/16 2256
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
## 2016-02-24 NOTE — ED Notes (Signed)
Resting quietly on stretcher w/eyes closed. Respirations even, unlabored.  

## 2016-02-24 NOTE — ED Notes (Signed)
Lawson FiscalLori, Nurse 1st, aware pt's friend, Asher MuirJamie, coming to ED to pick up pt per pt's request.

## 2016-02-24 NOTE — ED Notes (Signed)
Pt assisted back to bed, linens changed. Ok to remove philly collar per Dr. Madilyn Hookees. Pt unsure of when last td.
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
## 2016-02-24 NOTE — ED Notes (Signed)
Pt woke to use urinal then returned to sleeping. States did not want to eat yet.

## 2016-02-24 NOTE — ED Notes (Signed)
Pt sitting up in bed eating meal tray delivered pt able to supply phone number of person to come and pick up the pt. Pt appears in no distress able to assist himself in dressing

## 2016-02-26 ENCOUNTER — Encounter (HOSPITAL_COMMUNITY): Payer: Self-pay | Admitting: Emergency Medicine

## 2016-02-28 ENCOUNTER — Encounter (HOSPITAL_COMMUNITY): Payer: Self-pay | Admitting: Emergency Medicine

## 2016-02-28 ENCOUNTER — Ambulatory Visit (HOSPITAL_COMMUNITY)
Admission: EM | Admit: 2016-02-28 | Discharge: 2016-02-28 | Disposition: A | Discharge status: Home / Self Care | Payer: Medicare Other | Attending: Family Medicine | Admitting: Family Medicine

## 2016-02-28 DIAGNOSIS — Z4802 Encounter for removal of sutures: Secondary | ICD-10-CM | POA: Diagnosis not present

## 2016-02-28 NOTE — ED Notes (Signed)
Removed three sutures from pt's right upper forehead.  Pt tolerated it well.  Bacitracin and a band aid applied.

## 2016-02-28 NOTE — ED Triage Notes (Signed)
Pt is here to have his stitches checked on his right upper forehead.  Pt denies any issues, no fever, and only a mild, dull pain at the site.  The site is clean, dry, and intact and was covered by a band aid.

## 2016-02-28 NOTE — ED Provider Notes (Signed)
[facility]    CSN: [id number] Arrival date & time: 02/28/16  1328     History   Chief Complaint Chief Complaint  Patient presents with  . Wound Check    HPI [name] is a 55 y.o. male.   The history is provided by the patient.  Wound Check  This is a new problem. The current episode started more than 2 days ago (lac repair 5d ago, no problems.). The problem has been rapidly improving.    Past Medical History:  Diagnosis Date  . Asthma     There are no active problems to display for this patient.   Past Surgical History:  Procedure Laterality Date  . HIP SURGERY         Home Medications    Prior to Admission medications   Medication Sig Start Date End Date Taking? Authorizing Provider  acetaminophen (TYLENOL) 500 MG tablet Take 1 tablet (500 mg total) by mouth every 6 (six) hours as needed for pain. Patient not taking: Reported on 02/28/2016 08/14/12   Fayrene HelperBowie Tran, PA-C  chlorpheniramine-HYDROcodone [facility](TUSSIONEX PENNKINETIC ER) 10-8 MG/5ML LQCR Take 5 mLs by mouth every 12 (twelve) hours as needed for cough. Patient not taking: Reported on 03/13/2015 08/09/14   Linna HoffJames D Kindl, MD  clindamycin (CLEOCIN) 150 MG capsule Take 1 capsule (150 mg total) by mouth every 6 (six) hours. 03/13/15   Samantha Tripp Dowless, PA-C  levofloxacin (LEVAQUIN) 500 MG tablet Take 1 tablet (500 mg total) by mouth daily. Patient not taking: Reported on 03/13/2015 08/09/14   Linna HoffJames D Kindl, MD  oxyCODONE-acetaminophen (PERCOCET) 10-325 MG tablet Take 1 tablet by mouth every 4 (four) hours as needed. Hip pain 02/10/15   Historical Provider, MD  oxyCODONE-acetaminophen (PERCOCET/ROXICET) 5-325 MG per tablet Take 1 tablet by mouth every 6 (six) hours as needed for pain. Patient not taking: Reported on 03/13/2015 07/21/12   Sunnie NielsenBrian Opitz, MD    Family History History reviewed. No pertinent family history.  Social History Social History  Substance Use Topics  . Smoking status:  Never Smoker  . Smokeless tobacco: Never Used  . Alcohol use Yes     Comment: social     Allergies   Aspirin and Motrin [ibuprofen]   Review of Systems Review of Systems  Constitutional: Negative.   Skin: Positive for wound.  All other systems reviewed and are negative.    Physical Exam Triage Vital Signs ED Triage Vitals [02/28/16 1441]  Enc Vitals Group     BP 133/70     Pulse Rate 97     Resp 12     Temp 98.8 F (37.1 C)     Temp Source Oral     SpO2 100 %     Weight      Height      Head Circumference      Peak Flow      Pain Score 2     Pain Loc      Pain Edu?      Excl. in GC?    No data found.   Updated Vital Signs BP 133/70 (BP Location: Left Arm)   Pulse 97   Temp 98.8 F (37.1 C) (Oral)   Resp 12   SpO2 100%   Visual Acuity Right Eye Distance:   Left Eye Distance:   Bilateral Distance:    Right Eye Near:   Left Eye Near:    Bilateral Near:     Physical Exam  Constitutional:  He appears well-developed and well-nourished.  Skin: Skin is warm.  Forehead lac healed, sutures removed  Nursing note and vitals reviewed.    UC Treatments / Results  Labs (all labs ordered are listed, but only abnormal results are displayed) Labs Reviewed - No data to display  EKG  EKG Interpretation None       Radiology No results found.  Procedures Procedures (including critical care time)  Medications Ordered in UC Medications - No data to display   Initial Impression / Assessment and Plan / UC Course  I have reviewed the triage vital signs and the nursing notes.  Pertinent labs & imaging results that were available during my care of the patient were reviewed by me and considered in my medical decision making (see chart for details).  Clinical Course      Final Clinical Impressions(s) / UC Diagnoses   Final diagnoses:  None    New Prescriptions New Prescriptions   No medications on file     Linna Hoff, MD 03/01/16  2019

## 2016-06-10 DIAGNOSIS — I158 Other secondary hypertension: Secondary | ICD-10-CM | POA: Diagnosis not present

## 2016-06-10 DIAGNOSIS — M25551 Pain in right hip: Secondary | ICD-10-CM | POA: Diagnosis not present

## 2016-07-11 DIAGNOSIS — M25551 Pain in right hip: Secondary | ICD-10-CM | POA: Diagnosis not present

## 2016-07-11 DIAGNOSIS — I158 Other secondary hypertension: Secondary | ICD-10-CM | POA: Diagnosis not present

## 2016-07-11 DIAGNOSIS — Z79891 Long term (current) use of opiate analgesic: Secondary | ICD-10-CM | POA: Diagnosis not present

## 2016-08-08 DIAGNOSIS — I158 Other secondary hypertension: Secondary | ICD-10-CM | POA: Diagnosis not present

## 2016-08-08 DIAGNOSIS — I152 Hypertension secondary to endocrine disorders: Secondary | ICD-10-CM | POA: Diagnosis not present

## 2016-08-08 DIAGNOSIS — Z79891 Long term (current) use of opiate analgesic: Secondary | ICD-10-CM | POA: Diagnosis not present

## 2016-08-08 DIAGNOSIS — M25551 Pain in right hip: Secondary | ICD-10-CM | POA: Diagnosis not present

## 2016-09-05 DIAGNOSIS — Z79891 Long term (current) use of opiate analgesic: Secondary | ICD-10-CM | POA: Diagnosis not present

## 2016-12-09 DIAGNOSIS — Z79891 Long term (current) use of opiate analgesic: Secondary | ICD-10-CM | POA: Diagnosis not present

## 2017-01-08 DIAGNOSIS — Z79891 Long term (current) use of opiate analgesic: Secondary | ICD-10-CM | POA: Diagnosis not present

## 2017-02-10 DIAGNOSIS — Z79891 Long term (current) use of opiate analgesic: Secondary | ICD-10-CM | POA: Diagnosis not present

## 2017-02-25 ENCOUNTER — Ambulatory Visit (INDEPENDENT_AMBULATORY_CARE_PROVIDER_SITE_OTHER): Payer: Medicare Other | Admitting: Orthopedic Surgery

## 2017-02-25 ENCOUNTER — Ambulatory Visit (INDEPENDENT_AMBULATORY_CARE_PROVIDER_SITE_OTHER): Payer: Medicare Other

## 2017-02-25 ENCOUNTER — Encounter (INDEPENDENT_AMBULATORY_CARE_PROVIDER_SITE_OTHER): Payer: Self-pay | Admitting: Orthopedic Surgery

## 2017-02-25 DIAGNOSIS — M12551 Traumatic arthropathy, right hip: Secondary | ICD-10-CM | POA: Diagnosis not present

## 2017-02-25 DIAGNOSIS — M25551 Pain in right hip: Secondary | ICD-10-CM

## 2017-02-25 NOTE — Progress Notes (Signed)
   Office Visit Note   Patient: [name]           Date of Birth: 1961-02-16           MRN: [id number] Visit Date: 02/25/2017              Requested by: No referring provider defined for this encounter. PCP: Patient, No Pcp Per  Chief Complaint  Patient presents with  . Right Hip - Pain      HPI: Patient is a 56 year old gentleman who is 17 years status post open reduction internal fixation for a posterior wall posterior column acetabular fracture on the right. Patient has been having increasing pain and decreasing range of motion of the right hip complaints of groin pain. Denies any radicular symptoms.  Assessment & Plan: Visit Diagnoses:  1. Pain in right hip   2. Traumatic arthritis of right hip     Plan: Recommend patient proceed with a total hip arthroplasty. We'll have him follow with Dr. Magnus Ivan for anterior approach for total hip arthroplasty.  Follow-Up Instructions: Return in about 2 weeks (around 03/11/2017).   Ortho Exam  Patient is alert, oriented, no adenopathy, well-dressed, normal affect, normal respiratory effort. Examination patient does have an abductor lurch and swings his body over the right hip for ambulation. Examination he has 0 of internal rotation 30 of external rotation negative straight leg raise.  Imaging: Xr Hip Unilat W Or W/o Pelvis 2-3 Views Right  Result Date: 02/25/2017 Two-view radiographs of the right hip shows collapse of the femoral head as well as joint space narrowing with essentially bone-on-bone contact with stable internal fixation from previous posterior wall and posterior column acetabular fracture.  No images are attached to the encounter.  Labs: No results found for: HGBA1C, ESRSEDRATE, CRP, LABURIC, REPTSTATUS, GRAMSTAIN, CULT, LABORGA  Orders:  Orders Placed This Encounter  Procedures  . XR HIP UNILAT W OR W/O PELVIS 2-3 VIEWS RIGHT   No orders of the defined types were placed in this encounter.    Procedures: No procedures performed  Clinical Data: No additional findings.  ROS:  All other systems negative, except as noted in the HPI. Review of Systems  Objective: Vital Signs: There were no vitals taken for this visit.  Specialty Comments:  No specialty comments available.  PMFS History: Patient Active Problem List   Diagnosis Date Noted  . Traumatic arthritis of right hip 02/25/2017  . Pain in right hip 02/25/2017   Past Medical History:  Diagnosis Date  . Asthma     History reviewed. No pertinent family history.  Past Surgical History:  Procedure Laterality Date  . HIP SURGERY     Social History   Occupational History  . Not on file.   Social History Main Topics  . Smoking status: Never Smoker  . Smokeless tobacco: Never Used  . Alcohol use Yes     Comment: social  . Drug use: No  . Sexual activity: Not on file

## 2017-03-03 ENCOUNTER — Ambulatory Visit (INDEPENDENT_AMBULATORY_CARE_PROVIDER_SITE_OTHER): Payer: Medicare Other | Admitting: Orthopaedic Surgery

## 2017-03-03 DIAGNOSIS — M12551 Traumatic arthropathy, right hip: Secondary | ICD-10-CM | POA: Diagnosis not present

## 2017-03-03 NOTE — Progress Notes (Signed)
Office Visit Note   Patient: [name]           Date of Birth: [birth year]           MRN: [id number] Visit Date: 03/03/2017              Requested by: No referring provider defined for this encounter. PCP: Patient, No Pcp Per   Assessment & Plan: Visit Diagnoses:  1. Traumatic arthritis of right hip     Plan: Due to the severity of his posterior medical arthritis and his pain I am recommending a hip replacement this standpoint. This can be done through direct anterior approach so as not to disrupt the posterior elements of the hip given his previous surgery. I gave him a hip model and showed him in detail with the surgery involves and gave him a handout on hip replacement surgery. We talked about the risk and benefits of the surgery as well as a detailed discussion of the intraoperative and postoperative course and what this would involve afterwards. All questions and concerns were answered and addressed. We'll work on getting the surgery set up soon.  Follow-Up Instructions: Return for 2 weeks post-op.   Orders:  No orders of the defined types were placed in this encounter.  No orders of the defined types were placed in this encounter.     Procedures: No procedures performed   Clinical Data: No additional findings.   Subjective: No chief complaint on file. Patient is referred to me by one of my partners. He is over a decade out from open reduction-internal fixation of a right acetabular fracture with hip dislocation. He has developed significant posterior medical arthritis since then. He's been hurting for over a year now he walks with a significant limp. He has x-ray evidence of severe end-stage arthritis post-traumatically of that right hip. At this point he wants to consider hip replacement surgery and this is been discussed by my partner as well. His pain is daily and is 10 out of 10. His detrimentally affected his activities daily living, his quality of life, his  mobility.  HPI  Review of Systems He denies any chest pain, short of breath, fever, chills, nausea, vomiting, headache  Objective: Vital Signs: There were no vitals taken for this visit.  Physical Exam  He is alert or 3 and in no acute distress Ortho Exam Examination of his left hip is normal. Examination of his right hip essentially shows no rotation at all with severe pain. I can flex and extend them with rotation is almost non-existent. Specialty Comments:  No specialty comments available.  Imaging: No results found. X-rays and apparently reviewed show severe post right arthritis with a likely osteonecrosis element. There is intact hardware posteriorly. There is complete loss of joint space  PMFS History: Patient Active Problem List   Diagnosis Date Noted  . Traumatic arthritis of right hip 02/25/2017  . Pain in right hip 02/25/2017   Past Medical History:  Diagnosis Date  . Asthma     No family history on file.  Past Surgical History:  Procedure Laterality Date  . HIP SURGERY     Social History   Occupational History  . Not on file.   Social History Main Topics  . Smoking status: Never Smoker  . Smokeless tobacco: Never Used  . Alcohol use Yes     Comment: social  . Drug use: No  . Sexual activity: Not on file

## 2017-03-06 ENCOUNTER — Other Ambulatory Visit (INDEPENDENT_AMBULATORY_CARE_PROVIDER_SITE_OTHER): Payer: Self-pay | Admitting: Orthopaedic Surgery

## 2017-03-06 DIAGNOSIS — M1651 Unilateral post-traumatic osteoarthritis, right hip: Secondary | ICD-10-CM

## 2017-03-10 ENCOUNTER — Encounter (INDEPENDENT_AMBULATORY_CARE_PROVIDER_SITE_OTHER): Payer: Self-pay

## 2017-03-10 ENCOUNTER — Encounter (HOSPITAL_COMMUNITY): Payer: Self-pay

## 2017-03-10 ENCOUNTER — Other Ambulatory Visit: Payer: Self-pay

## 2017-03-10 ENCOUNTER — Encounter (HOSPITAL_COMMUNITY)
Admission: RE | Admit: 2017-03-10 | Discharge: 2017-03-10 | Disposition: A | Discharge status: Home / Self Care | Payer: Medicare Other | Source: Ambulatory Visit | Attending: Orthopaedic Surgery | Admitting: Orthopaedic Surgery

## 2017-03-10 ENCOUNTER — Telehealth (INDEPENDENT_AMBULATORY_CARE_PROVIDER_SITE_OTHER): Payer: Self-pay | Admitting: Orthopaedic Surgery

## 2017-03-10 DIAGNOSIS — Z01812 Encounter for preprocedural laboratory examination: Secondary | ICD-10-CM | POA: Diagnosis not present

## 2017-03-10 DIAGNOSIS — M1611 Unilateral primary osteoarthritis, right hip: Secondary | ICD-10-CM | POA: Insufficient documentation

## 2017-03-10 DIAGNOSIS — Z01810 Encounter for preprocedural cardiovascular examination: Secondary | ICD-10-CM | POA: Diagnosis not present

## 2017-03-10 LAB — BASIC METABOLIC PANEL
ANION GAP: 7 (ref 5–15)
BUN: 8 mg/dL (ref 6–20)
CO2: 25 mmol/L (ref 22–32)
Calcium: 9.4 mg/dL (ref 8.9–10.3)
Chloride: 107 mmol/L (ref 101–111)
Creatinine, Ser: 1.05 mg/dL (ref 0.61–1.24)
GFR calc Af Amer: >60 mL/min (ref >60)
GFR calc non Af Amer: >60 mL/min (ref >60)
Glucose, Bld: 94 mg/dL (ref 65–99)
POTASSIUM: 3.7 mmol/L (ref 3.5–5.1)
Sodium: 139 mmol/L (ref 135–145)

## 2017-03-10 LAB — SURGICAL PCR SCREEN
MRSA, PCR: NEGATIVE
STAPHYLOCOCCUS AUREUS: NEGATIVE

## 2017-03-10 LAB — CBC
HEMATOCRIT: 37 % — AB (ref 39.0–52.0)
HEMOGLOBIN: 12.3 g/dL — AB (ref 13.0–17.0)
MCH: 27.8 pg (ref 26.0–34.0)
MCHC: 33.2 g/dL (ref 30.0–36.0)
MCV: 83.5 fL (ref 78.0–100.0)
Platelets: 210 10*3/uL (ref 150–400)
RBC: 4.43 MIL/uL (ref 4.22–5.81)
RDW: 13.5 % (ref 11.5–15.5)
WBC: 6.3 10*3/uL (ref 4.0–10.5)

## 2017-03-10 NOTE — Telephone Encounter (Signed)
Patient called asked if Dr Magnus Ivan will write a letter stating he is going to have surgery. Patient said he has a court date 03/13/17 and want the court date put off until after his surgery. Patient said he is going through emotional stress getting ready for the surgery.Patient said his attorney's name is Virgina Evener.  The number to contact patient is [phone]

## 2017-03-10 NOTE — Telephone Encounter (Signed)
Please advise 

## 2017-03-10 NOTE — Telephone Encounter (Signed)
It is okay to give a note for the courts and for his attorney that states it is medically necessary for him to be excused from Court due to severe hip disease and due to his upcoming hip replacement surgery. We would like to have his Court date postponed for at least 3 months.

## 2017-03-10 NOTE — Telephone Encounter (Signed)
Patient aware note at front desk  

## 2017-03-10 NOTE — Pre-Procedure Instructions (Signed)
[name]  03/10/2017      [id number] - [place], [place] - [address] AT [facility] [address] [place] [address] Phone: [phone] Fax: [phone]  [facility] [address] [place], [place] - [address] [address] Phone: [phone] Fax: [phone]    Your procedure is scheduled on March 20, 2017.  Report to [facility] at 830 AM.  Call this number if you have problems the morning of surgery:  [phone]   Remember:  Do not eat food or drink liquids after midnight.  Take these medicines the morning of surgery with A SIP OF WATER oxycodone-acetaminophen (percocet).   7 days prior to surgery STOP taking any Aspirin, Aleve, Naproxen, Ibuprofen, Motrin, Advil, Goody's, BC's, all herbal medications, fish oil, and all vitamins  Continue all other medications as instructed by your physician except follow the above medication instructions before surgery   Do not wear jewelry, make-up or nail polish.  Do not wear lotions, powders, or perfumes, or deoderant.  Men may shave face and neck.  Do not bring valuables to the hospital.  [facility] is not responsible for any belongings or valuables.  Contacts, dentures or bridgework may not be worn into surgery.  Leave your suitcase in the car.  After surgery it may be brought to your room.  For patients admitted to the hospital, discharge time will be determined by your treatment team.  Patients discharged the day of surgery will not be allowed to drive home.   Special instructions:   [facility]- Preparing For Surgery  Before surgery, you can play an important role. Because skin is not sterile, your skin needs to be as free of germs as possible. You can reduce the number of germs on your skin by washing with CHG (chlorahexidine gluconate) Soap before surgery.  CHG is an antiseptic cleaner which  kills germs and bonds with the skin to continue killing germs even after washing.  Please do not use if you have an allergy to CHG or antibacterial soaps. If your skin becomes reddened/irritated stop using the CHG.  Do not shave (including legs and underarms) for at least 48 hours prior to first CHG shower. It is OK to shave your face.  Please follow these instructions carefully.   1. Shower the NIGHT BEFORE SURGERY and the MORNING OF SURGERY with CHG.   2. If you chose to wash your hair, wash your hair first as usual with your normal shampoo.  3. After you shampoo, rinse your hair and body thoroughly to remove the shampoo.  4. Use CHG as you would any other liquid soap. You can apply CHG directly to the skin and wash gently with a scrungie or a clean washcloth.   5. Apply the CHG Soap to your body ONLY FROM THE NECK DOWN.  Do not use on open wounds or open sores. Avoid contact with your eyes, ears, mouth and genitals (private parts). Wash Face and genitals (private parts)  with your normal soap.  6. Wash thoroughly, paying special attention to the area where your surgery will be performed.  7. Thoroughly rinse your body with warm water from the neck down.  8. DO NOT shower/wash with your normal soap after using and rinsing off the CHG Soap.  9. Pat yourself dry with a CLEAN TOWEL.  10. Wear CLEAN PAJAMAS to bed the night before surgery, wear comfortable clothes  the morning of surgery  11. Place CLEAN SHEETS on your bed the night of your first shower and DO NOT SLEEP WITH PETS.    Day of Surgery: Do not apply any deodorants/lotions. Please wear clean clothes to the hospital/surgery center.    Please read over the following fact sheets that you were given. Pain Booklet, Coughing and Deep Breathing, MRSA Information and Surgical Site Infection Prevention

## 2017-03-10 NOTE — Progress Notes (Addendum)
PCP: Dr.Wayland McKenzie  Cardiologist: denies   EKG: denies past year  Stress test: pt denies ever  ECHO: denies ever  Cardiac Cath: denies ever  Chest x-ray: denies past year-no recent respiratory complications

## 2017-03-11 ENCOUNTER — Other Ambulatory Visit (INDEPENDENT_AMBULATORY_CARE_PROVIDER_SITE_OTHER): Payer: Self-pay

## 2017-03-19 MED ORDER — DEXTROSE 5 % IV SOLN
3.0000 g | INTRAVENOUS | Status: AC
  Administered 2017-03-20: 3 g via INTRAVENOUS
  Filled 2017-03-19: qty 3000

## 2017-03-19 MED ORDER — TRANEXAMIC ACID 1000 MG/10ML IV SOLN
1000.0000 mg | INTRAVENOUS | Status: AC
  Administered 2017-03-20: 1000 mg via INTRAVENOUS
  Filled 2017-03-19: qty 10

## 2017-03-20 ENCOUNTER — Encounter (HOSPITAL_COMMUNITY)
Admission: RE | Disposition: A | Discharge status: Home Health | Payer: Self-pay | Source: Ambulatory Visit | Attending: Orthopaedic Surgery

## 2017-03-20 ENCOUNTER — Inpatient Hospital Stay (HOSPITAL_COMMUNITY)
Admission: RE | Admit: 2017-03-20 | Discharge: 2017-03-22 | DRG: 470 | Disposition: A | Discharge status: Home Health | Payer: Medicare Other | Source: Ambulatory Visit | Attending: Orthopaedic Surgery | Admitting: Orthopaedic Surgery

## 2017-03-20 ENCOUNTER — Inpatient Hospital Stay (HOSPITAL_COMMUNITY): Payer: Medicare Other

## 2017-03-20 ENCOUNTER — Inpatient Hospital Stay (HOSPITAL_COMMUNITY): Payer: Medicare Other | Admitting: Anesthesiology

## 2017-03-20 ENCOUNTER — Encounter (HOSPITAL_COMMUNITY): Payer: Self-pay | Admitting: Anesthesiology

## 2017-03-20 DIAGNOSIS — Z9181 History of falling: Secondary | ICD-10-CM

## 2017-03-20 DIAGNOSIS — M12551 Traumatic arthropathy, right hip: Secondary | ICD-10-CM | POA: Diagnosis not present

## 2017-03-20 DIAGNOSIS — D62 Acute posthemorrhagic anemia: Secondary | ICD-10-CM | POA: Diagnosis not present

## 2017-03-20 DIAGNOSIS — Z6841 Body Mass Index (BMI) 40.0 and over, adult: Secondary | ICD-10-CM | POA: Diagnosis not present

## 2017-03-20 DIAGNOSIS — E669 Obesity, unspecified: Secondary | ICD-10-CM | POA: Diagnosis not present

## 2017-03-20 DIAGNOSIS — M1651 Unilateral post-traumatic osteoarthritis, right hip: Secondary | ICD-10-CM | POA: Diagnosis not present

## 2017-03-20 DIAGNOSIS — Z96641 Presence of right artificial hip joint: Secondary | ICD-10-CM | POA: Diagnosis not present

## 2017-03-20 DIAGNOSIS — J45909 Unspecified asthma, uncomplicated: Secondary | ICD-10-CM | POA: Diagnosis present

## 2017-03-20 DIAGNOSIS — M25551 Pain in right hip: Secondary | ICD-10-CM | POA: Diagnosis not present

## 2017-03-20 DIAGNOSIS — Z471 Aftercare following joint replacement surgery: Secondary | ICD-10-CM | POA: Diagnosis not present

## 2017-03-20 DIAGNOSIS — Z419 Encounter for procedure for purposes other than remedying health state, unspecified: Secondary | ICD-10-CM

## 2017-03-20 HISTORY — PX: TOTAL HIP ARTHROPLASTY: SHX124

## 2017-03-20 SURGERY — ARTHROPLASTY, HIP, TOTAL, ANTERIOR APPROACH
Anesthesia: Spinal | Site: Hip | Laterality: Right

## 2017-03-20 MED ORDER — SODIUM CHLORIDE 0.9 % IR SOLN
Status: DC | PRN
Start: 2017-03-20 — End: 2017-03-20
  Administered 2017-03-20: 3000 mL

## 2017-03-20 MED ORDER — OXYCODONE HCL 5 MG PO TABS: 5.0000 mg | ORAL_TABLET | Freq: Once | ORAL | Status: DC | PRN

## 2017-03-20 MED ORDER — SODIUM CHLORIDE 0.9 % IV SOLN
INTRAVENOUS | Status: DC
  Administered 2017-03-20: 21:00:00 via INTRAVENOUS

## 2017-03-20 MED ORDER — HYDROCODONE-ACETAMINOPHEN 10-325 MG PO TABS
1.0000 | ORAL_TABLET | ORAL | Status: DC | PRN
  Administered 2017-03-20: 1 via ORAL
  Filled 2017-03-20: qty 1

## 2017-03-20 MED ORDER — ONDANSETRON HCL 4 MG/2ML IJ SOLN
INTRAMUSCULAR | Status: AC
  Filled 2017-03-20: qty 2

## 2017-03-20 MED ORDER — EPHEDRINE SULFATE 50 MG/ML IJ SOLN
INTRAMUSCULAR | Status: DC | PRN
  Administered 2017-03-20 (×2): 5 mg via INTRAVENOUS
  Administered 2017-03-20: 10 mg via INTRAVENOUS
  Administered 2017-03-20: 5 mg via INTRAVENOUS
  Administered 2017-03-20: 10 mg via INTRAVENOUS

## 2017-03-20 MED ORDER — PHENYLEPHRINE HCL 10 MG/ML IJ SOLN
INTRAVENOUS | Status: DC | PRN
  Administered 2017-03-20: 50 ug/min via INTRAVENOUS

## 2017-03-20 MED ORDER — PHENOL 1.4 % MT LIQD: 1.0000 | OROMUCOSAL | Status: DC | PRN

## 2017-03-20 MED ORDER — DEXAMETHASONE SODIUM PHOSPHATE 10 MG/ML IJ SOLN
INTRAMUSCULAR | Status: DC | PRN
  Administered 2017-03-20: 10 mg via INTRAVENOUS

## 2017-03-20 MED ORDER — DEXAMETHASONE SODIUM PHOSPHATE 10 MG/ML IJ SOLN
INTRAMUSCULAR | Status: AC
  Filled 2017-03-20: qty 1

## 2017-03-20 MED ORDER — ALBUMIN HUMAN 5 % IV SOLN
INTRAVENOUS | Status: DC | PRN
  Administered 2017-03-20: 16:00:00 via INTRAVENOUS

## 2017-03-20 MED ORDER — FENTANYL CITRATE (PF) 250 MCG/5ML IJ SOLN
INTRAMUSCULAR | Status: DC | PRN
  Administered 2017-03-20 (×3): 50 ug via INTRAVENOUS

## 2017-03-20 MED ORDER — METHOCARBAMOL 1000 MG/10ML IJ SOLN
500.0000 mg | Freq: Four times a day (QID) | INTRAVENOUS | Status: DC | PRN
  Filled 2017-03-20: qty 5

## 2017-03-20 MED ORDER — PROPOFOL 10 MG/ML IV BOLUS
INTRAVENOUS | Status: AC
  Filled 2017-03-20: qty 40

## 2017-03-20 MED ORDER — ROCURONIUM BROMIDE 10 MG/ML (PF) SYRINGE
PREFILLED_SYRINGE | INTRAVENOUS | Status: AC
  Filled 2017-03-20: qty 5

## 2017-03-20 MED ORDER — MIDAZOLAM HCL 2 MG/2ML IJ SOLN
INTRAMUSCULAR | Status: AC
  Filled 2017-03-20: qty 2

## 2017-03-20 MED ORDER — MIDAZOLAM HCL 5 MG/5ML IJ SOLN
INTRAMUSCULAR | Status: DC | PRN
  Administered 2017-03-20: 2 mg via INTRAVENOUS

## 2017-03-20 MED ORDER — DIPHENHYDRAMINE HCL 12.5 MG/5ML PO ELIX: 12.5000 mg | ORAL_SOLUTION | ORAL | Status: DC | PRN

## 2017-03-20 MED ORDER — DOCUSATE SODIUM 100 MG PO CAPS
100.0000 mg | ORAL_CAPSULE | Freq: Two times a day (BID) | ORAL | Status: DC
  Administered 2017-03-20 – 2017-03-22 (×4): 100 mg via ORAL
  Filled 2017-03-20 (×4): qty 1

## 2017-03-20 MED ORDER — MENTHOL 3 MG MT LOZG: 1.0000 | LOZENGE | OROMUCOSAL | Status: DC | PRN

## 2017-03-20 MED ORDER — ASPIRIN 81 MG PO CHEW
81.0000 mg | CHEWABLE_TABLET | Freq: Two times a day (BID) | ORAL | Status: DC
  Administered 2017-03-20 – 2017-03-22 (×4): 81 mg via ORAL
  Filled 2017-03-20 (×4): qty 1

## 2017-03-20 MED ORDER — METHOCARBAMOL 500 MG PO TABS
500.0000 mg | ORAL_TABLET | Freq: Four times a day (QID) | ORAL | Status: DC | PRN
  Administered 2017-03-21 (×2): 500 mg via ORAL
  Filled 2017-03-20 (×2): qty 1

## 2017-03-20 MED ORDER — FENTANYL CITRATE (PF) 250 MCG/5ML IJ SOLN
INTRAMUSCULAR | Status: AC
  Filled 2017-03-20: qty 5

## 2017-03-20 MED ORDER — HYDROMORPHONE HCL 1 MG/ML IJ SOLN: 1.0000 mg | INTRAMUSCULAR | Status: DC | PRN

## 2017-03-20 MED ORDER — METOCLOPRAMIDE HCL 5 MG/ML IJ SOLN
5.0000 mg | Freq: Three times a day (TID) | INTRAMUSCULAR | Status: DC | PRN
  Filled 2017-03-20: qty 2

## 2017-03-20 MED ORDER — HYDROMORPHONE HCL 1 MG/ML IJ SOLN: 0.2500 mg | INTRAMUSCULAR | Status: DC | PRN

## 2017-03-20 MED ORDER — LIDOCAINE 2% (20 MG/ML) 5 ML SYRINGE
INTRAMUSCULAR | Status: AC
  Filled 2017-03-20: qty 5

## 2017-03-20 MED ORDER — LACTATED RINGERS IV SOLN
INTRAVENOUS | Status: DC | PRN
  Administered 2017-03-20 (×2): via INTRAVENOUS

## 2017-03-20 MED ORDER — PROPOFOL 500 MG/50ML IV EMUL
INTRAVENOUS | Status: DC | PRN
  Administered 2017-03-20: 75 ug/kg/min via INTRAVENOUS

## 2017-03-20 MED ORDER — OXYCODONE HCL 5 MG/5ML PO SOLN: 5.0000 mg | Freq: Once | ORAL | Status: DC | PRN

## 2017-03-20 MED ORDER — ACETAMINOPHEN 325 MG PO TABS: 650.0000 mg | ORAL_TABLET | ORAL | Status: DC | PRN

## 2017-03-20 MED ORDER — ONDANSETRON HCL 4 MG/2ML IJ SOLN
4.0000 mg | Freq: Four times a day (QID) | INTRAMUSCULAR | Status: DC | PRN
  Administered 2017-03-21: 4 mg via INTRAVENOUS
  Filled 2017-03-20: qty 2

## 2017-03-20 MED ORDER — BUPIVACAINE IN DEXTROSE 0.75-8.25 % IT SOLN
INTRATHECAL | Status: DC | PRN
  Administered 2017-03-20: 2 mL via INTRATHECAL

## 2017-03-20 MED ORDER — ONDANSETRON HCL 4 MG PO TABS: 4.0000 mg | ORAL_TABLET | Freq: Four times a day (QID) | ORAL | Status: DC | PRN

## 2017-03-20 MED ORDER — CEFAZOLIN SODIUM-DEXTROSE 2-4 GM/100ML-% IV SOLN
2.0000 g | Freq: Four times a day (QID) | INTRAVENOUS | Status: AC
  Administered 2017-03-20 – 2017-03-21 (×2): 2 g via INTRAVENOUS
  Filled 2017-03-20 (×2): qty 100

## 2017-03-20 MED ORDER — 0.9 % SODIUM CHLORIDE (POUR BTL) OPTIME
TOPICAL | Status: DC | PRN
  Administered 2017-03-20: 1000 mL

## 2017-03-20 MED ORDER — OXYCODONE HCL 5 MG PO TABS
10.0000 mg | ORAL_TABLET | ORAL | Status: DC | PRN
  Administered 2017-03-21 (×2): 10 mg via ORAL
  Administered 2017-03-21: 15 mg via ORAL
  Administered 2017-03-21 (×2): 10 mg via ORAL
  Administered 2017-03-22: 15 mg via ORAL
  Filled 2017-03-20: qty 2
  Filled 2017-03-20: qty 3
  Filled 2017-03-20 (×2): qty 2
  Filled 2017-03-20: qty 3
  Filled 2017-03-20: qty 2

## 2017-03-20 MED ORDER — EPHEDRINE 5 MG/ML INJ
INTRAVENOUS | Status: AC
  Filled 2017-03-20: qty 10

## 2017-03-20 MED ORDER — ACETAMINOPHEN 650 MG RE SUPP: 650.0000 mg | RECTAL | Status: DC | PRN

## 2017-03-20 MED ORDER — POLYETHYLENE GLYCOL 3350 17 G PO PACK
17.0000 g | PACK | Freq: Every day | ORAL | Status: DC | PRN
  Administered 2017-03-21: 17 g via ORAL
  Filled 2017-03-20: qty 1

## 2017-03-20 MED ORDER — METOCLOPRAMIDE HCL 5 MG PO TABS: 5.0000 mg | ORAL_TABLET | Freq: Three times a day (TID) | ORAL | Status: DC | PRN

## 2017-03-20 MED ORDER — LIDOCAINE 2% (20 MG/ML) 5 ML SYRINGE
INTRAMUSCULAR | Status: DC | PRN
  Administered 2017-03-20: 60 mg via INTRAVENOUS

## 2017-03-20 MED ORDER — GABAPENTIN 100 MG PO CAPS
100.0000 mg | ORAL_CAPSULE | Freq: Three times a day (TID) | ORAL | Status: DC
  Administered 2017-03-20 – 2017-03-22 (×5): 100 mg via ORAL
  Filled 2017-03-20 (×5): qty 1

## 2017-03-20 SURGICAL SUPPLY — 58 items
APL SKNCLS STERI-STRIP NONHPOA (GAUZE/BANDAGES/DRESSINGS)
BENZOIN TINCTURE PRP APPL 2/3 (GAUZE/BANDAGES/DRESSINGS) ×1 IMPLANT
BLADE CLIPPER SURG (BLADE) IMPLANT
BLADE SAW SGTL 18X1.27X75 (BLADE) ×2 IMPLANT
BLADE SAW SGTL 18X1.27X75MM (BLADE) ×1
CAPT HIP TOTAL 2 ×2 IMPLANT
CELLS DAT CNTRL 66122 CELL SVR (MISCELLANEOUS) ×1 IMPLANT
CLOSURE WOUND 1/2 X4 (GAUZE/BANDAGES/DRESSINGS)
COVER SURGICAL LIGHT HANDLE (MISCELLANEOUS) ×3 IMPLANT
CUP ACET PINNACLE SECTR 60MM (Hips) IMPLANT
DRAPE C-ARM 42X72 X-RAY (DRAPES) ×3 IMPLANT
DRAPE STERI IOBAN 125X83 (DRAPES) ×1 IMPLANT
DRAPE U-SHAPE 47X51 STRL (DRAPES) ×9 IMPLANT
DRSG AQUACEL AG ADV 3.5X10 (GAUZE/BANDAGES/DRESSINGS) ×3 IMPLANT
DURAPREP 26ML APPLICATOR (WOUND CARE) ×3 IMPLANT
ELECT BLADE 4.0 EZ CLEAN MEGAD (MISCELLANEOUS) ×3
ELECT BLADE 6.5 EXT (BLADE) ×2 IMPLANT
ELECT REM PT RETURN 9FT ADLT (ELECTROSURGICAL) ×3
ELECTRODE BLDE 4.0 EZ CLN MEGD (MISCELLANEOUS) ×1 IMPLANT
ELECTRODE REM PT RTRN 9FT ADLT (ELECTROSURGICAL) ×1 IMPLANT
FACESHIELD WRAPAROUND (MASK) ×9 IMPLANT
FACESHIELD WRAPAROUND OR TEAM (MASK) ×2 IMPLANT
GAUZE XEROFORM 1X8 LF (GAUZE/BANDAGES/DRESSINGS) ×2 IMPLANT
GLOVE BIOGEL PI IND STRL 8 (GLOVE) ×2 IMPLANT
GLOVE BIOGEL PI INDICATOR 8 (GLOVE) ×4
GLOVE ECLIPSE 8.0 STRL XLNG CF (GLOVE) ×3 IMPLANT
GLOVE ORTHO TXT STRL SZ7.5 (GLOVE) ×6 IMPLANT
GOWN STRL REUS W/ TWL LRG LVL3 (GOWN DISPOSABLE) ×2 IMPLANT
GOWN STRL REUS W/ TWL XL LVL3 (GOWN DISPOSABLE) ×2 IMPLANT
GOWN STRL REUS W/TWL 2XL LVL3 (GOWN DISPOSABLE) ×2 IMPLANT
GOWN STRL REUS W/TWL LRG LVL3 (GOWN DISPOSABLE) ×3
GOWN STRL REUS W/TWL XL LVL3 (GOWN DISPOSABLE) ×6
HANDPIECE INTERPULSE COAX TIP (DISPOSABLE) ×3
KIT BASIN OR (CUSTOM PROCEDURE TRAY) ×3 IMPLANT
KIT ROOM TURNOVER OR (KITS) ×3 IMPLANT
MANIFOLD NEPTUNE II (INSTRUMENTS) ×3 IMPLANT
NS IRRIG 1000ML POUR BTL (IV SOLUTION) ×3 IMPLANT
PACK TOTAL JOINT (CUSTOM PROCEDURE TRAY) ×3 IMPLANT
PAD ARMBOARD 7.5X6 YLW CONV (MISCELLANEOUS) ×3 IMPLANT
PINNSECTOR W/GRIP ACE CUP 60MM (Hips) ×3 IMPLANT
RETRACTOR WND ALEXIS 18 MED (MISCELLANEOUS) ×1 IMPLANT
RTRCTR WOUND ALEXIS 18CM MED (MISCELLANEOUS) ×3
SET HNDPC FAN SPRY TIP SCT (DISPOSABLE) ×1 IMPLANT
STAPLER VISISTAT 35W (STAPLE) ×2 IMPLANT
STRIP CLOSURE SKIN 1/2X4 (GAUZE/BANDAGES/DRESSINGS) ×2 IMPLANT
SUT ETHIBOND NAB CT1 #1 30IN (SUTURE) ×5 IMPLANT
SUT MNCRL AB 4-0 PS2 18 (SUTURE) IMPLANT
SUT VIC AB 0 CT1 27 (SUTURE) ×3
SUT VIC AB 0 CT1 27XBRD ANBCTR (SUTURE) ×1 IMPLANT
SUT VIC AB 1 CT1 27 (SUTURE) ×3
SUT VIC AB 1 CT1 27XBRD ANBCTR (SUTURE) ×1 IMPLANT
SUT VIC AB 2-0 CT1 27 (SUTURE) ×6
SUT VIC AB 2-0 CT1 TAPERPNT 27 (SUTURE) ×1 IMPLANT
TOWEL OR 17X24 6PK STRL BLUE (TOWEL DISPOSABLE) ×3 IMPLANT
TOWEL OR 17X26 10 PK STRL BLUE (TOWEL DISPOSABLE) ×3 IMPLANT
TRAY CATH 16FR W/PLASTIC CATH (SET/KITS/TRAYS/PACK) IMPLANT
TRAY FOLEY W/METER SILVER 16FR (SET/KITS/TRAYS/PACK) IMPLANT
WATER STERILE IRR 1000ML POUR (IV SOLUTION) ×2 IMPLANT

## 2017-03-20 NOTE — Transfer of Care (Signed)
Immediate Anesthesia Transfer of Care Note  Patient: [name]  Procedure(s) Performed: RIGHT TOTAL HIP ARTHROPLASTY ANTERIOR APPROACH (Right Hip)  Patient Location: PACU  Anesthesia Type:MAC and Spinal  Level of Consciousness: awake, alert  and oriented  Airway & Oxygen Therapy: Patient Spontanous Breathing and Patient connected to face mask oxygen  Post-op Assessment: Report given to RN and Post -op Vital signs reviewed and stable  Post vital signs: Reviewed and stable  Last Vitals:  Vitals:   03/20/17 0826 03/20/17 1653  BP: (!) 186/90   Pulse: 68 71  Resp: 18   Temp: 37.1 C   SpO2: 96% (P) 100%    Last Pain:  Vitals:   03/20/17 0841  TempSrc:   PainSc: 5       Patients Stated Pain Goal: 4 (06/26/41 8887)  Complications: No apparent anesthesia complications

## 2017-03-20 NOTE — H&P (Signed)
TOTAL HIP ADMISSION H&P  Patient is admitted for right total hip arthroplasty.  Subjective:  Chief Complaint: right hip pain  HPI: [name], 56 y.o. male, has a history of pain and functional disability in the right hip(s) due to trauma and arthritis and patient has failed non-surgical conservative treatments for greater than 12 weeks to include NSAID's and/or analgesics, corticosteriod injections, flexibility and strengthening excercises, use of assistive devices, weight reduction as appropriate and activity modification.  Onset of symptoms was gradual starting >10 years ago with gradually worsening course since that time.The patient noted no past surgery on the right hip(s).  Patient currently rates pain in the right hip at 10 out of 10 with activity. Patient has night pain, worsening of pain with activity and weight bearing, trendelenberg gait, pain that interfers with activities of daily living, pain with passive range of motion and crepitus. Patient has evidence of subchondral sclerosis, periarticular osteophytes and joint space narrowing by imaging studies. This condition presents safety issues increasing the risk of falls. This patient has had previous surgery on the right acetabulum.  There is no current active infection.  Patient Active Problem List   Diagnosis Date Noted  . Traumatic arthritis of right hip 02/25/2017  . Pain in right hip 02/25/2017   Past Medical History:  Diagnosis Date  . Asthma     Past Surgical History:  Procedure Laterality Date  . HIP SURGERY      Current Facility-Administered Medications  Medication Dose Route Frequency Provider Last Rate Last Dose  . ceFAZolin (ANCEF) 3 g in dextrose 5 % 50 mL IVPB  3 g Intravenous To SS-Surg Kathryne Hitch, MD      . tranexamic acid (CYKLOKAPRON) 1,000 mg in sodium chloride 0.9 % 100 mL IVPB  1,000 mg Intravenous To OR Kathryne Hitch, MD       Allergies  Allergen Reactions  . Aspirin Nausea  Only    Social History  Substance Use Topics  . Smoking status: Never Smoker  . Smokeless tobacco: Never Used  . Alcohol use Yes     Comment: social    History reviewed. No pertinent family history.   Review of Systems  Musculoskeletal: Positive for joint pain.  All other systems reviewed and are negative.   Objective:  Physical Exam  Constitutional: He is oriented to person, place, and time. He appears well-developed and well-nourished.  HENT:  Head: Normocephalic and atraumatic.  Eyes: Pupils are equal, round, and reactive to light. EOM are normal.  Neck: Normal range of motion. Neck supple.  Cardiovascular: Normal rate and regular rhythm.   Respiratory: Effort normal and breath sounds normal.  GI: Soft. Bowel sounds are normal.  Musculoskeletal:       Right hip: He exhibits decreased range of motion, decreased strength, tenderness and bony tenderness.  Neurological: He is alert and oriented to person, place, and time.  Skin: Skin is warm and dry.  Psychiatric: He has a normal mood and affect.    Vital signs in last 24 hours: Temp:  [98.8 F (37.1 C)] 98.8 F (37.1 C) (10/25 0826) Pulse Rate:  [68] 68 (10/25 0826) Resp:  [18] 18 (10/25 0826) BP: (186)/(90) 186/90 (10/25 0826) SpO2:  [96 %] 96 % (10/25 0826) Weight:  [260 lb (117.9 kg)] 260 lb (117.9 kg) (10/25 0826)  Labs:   Estimated body mass index is 40.72 kg/m as calculated from the following:   Height as of this encounter: 5\' 7"  (1.702 m).  Weight as of this encounter: 260 lb (117.9 kg).   Imaging Review Plain radiographs demonstrate severe degenerative joint disease of the right hip(s). The bone quality appears to be good for age and reported activity level.  Assessment/Plan:  End stage arthritis, right hip(s)  The patient history, physical examination, clinical judgement of the provider and imaging studies are consistent with end stage degenerative joint disease of the right hip(s) and total hip  arthroplasty is deemed medically necessary. The treatment options including medical management, injection therapy, arthroscopy and arthroplasty were discussed at length. The risks and benefits of total hip arthroplasty were presented and reviewed. The risks due to aseptic loosening, infection, stiffness, dislocation/subluxation,  thromboembolic complications and other imponderables were discussed.  The patient acknowledged the explanation, agreed to proceed with the plan and consent was signed. Patient is being admitted for inpatient treatment for surgery, pain control, PT, OT, prophylactic antibiotics, VTE prophylaxis, progressive ambulation and ADL's and discharge planning.The patient is planning to be discharged home with home health services

## 2017-03-20 NOTE — Brief Op Note (Signed)
03/20/2017  4:53 PM  PATIENT:  [name]  56 y.o. male  PRE-OPERATIVE DIAGNOSIS:  post traumatic osteoarthritis right hip  POST-OPERATIVE DIAGNOSIS:  post traumatic osteoarthritis right hip  PROCEDURE:  Procedure(s): RIGHT TOTAL HIP ARTHROPLASTY ANTERIOR APPROACH (Right)  SURGEON:  Surgeon(s) and Role:    * Kathryne HitchBlackman, Robertson Colclough Y, MD - Primary  PHYSICIAN ASSISTANT: Rexene EdisonGil Clark, PA-C  ANESTHESIA:   spinal  EBL:  950 mL   COUNTS:  YES  DICTATION: .Other Dictation: Dictation Number [id number]  PLAN OF CARE: Admit to inpatient   PATIENT DISPOSITION:  PACU - hemodynamically stable.   Delay start of Pharmacological VTE agent (>24hrs) due to surgical blood loss or risk of bleeding: no

## 2017-03-20 NOTE — Anesthesia Procedure Notes (Signed)
Procedure Name: MAC Date/Time: 03/20/2017 2:35 PM Performed by: Lieutenant Diego Pre-anesthesia Checklist: Patient identified, Emergency Drugs available, Suction available, Patient being monitored and Timeout performed Patient Re-evaluated:Patient Re-evaluated prior to induction Oxygen Delivery Method: Simple face mask Preoxygenation: Pre-oxygenation with 100% oxygen Induction Type: IV induction

## 2017-03-20 NOTE — Anesthesia Preprocedure Evaluation (Signed)
Anesthesia Evaluation  Patient identified by MRN, date of birth, ID band Patient awake    Reviewed: Allergy & Precautions, NPO status , Patient's Chart, lab work & pertinent test results  Airway Mallampati: II  TM Distance: >3 FB Neck ROM: Full    Dental  (+) Missing, Poor Dentition   Pulmonary asthma ,    breath sounds clear to auscultation       Cardiovascular  Rhythm:Regular Rate:Normal     Neuro/Psych    GI/Hepatic negative GI ROS, Neg liver ROS,   Endo/Other  negative endocrine ROS  Renal/GU negative Renal ROS     Musculoskeletal  (+) Arthritis ,   Abdominal   Peds  Hematology negative hematology ROS (+)   Anesthesia Other Findings   Reproductive/Obstetrics                             Anesthesia Physical Anesthesia Plan  ASA: II  Anesthesia Plan: Spinal   Post-op Pain Management:    Induction:   PONV Risk Score and Plan: 2 and Ondansetron, Dexamethasone and Treatment may vary due to age or medical condition  Airway Management Planned: Natural Airway  Additional Equipment:   Intra-op Plan:   Post-operative Plan:   Informed Consent: I have reviewed the patients History and Physical, chart, labs and discussed the procedure including the risks, benefits and alternatives for the proposed anesthesia with the patient or authorized representative who has indicated his/her understanding and acceptance.     Plan Discussed with: CRNA  Anesthesia Plan Comments:         Anesthesia Quick Evaluation

## 2017-03-20 NOTE — Anesthesia Procedure Notes (Signed)
Spinal  Patient location during procedure: OR Start time: 03/20/2017 2:30 PM End time: 03/20/2017 2:39 PM Staffing Anesthesiologist: Sharee HolsterMASSAGEE, [name] Performed: anesthesiologist  Preanesthetic Checklist Completed: patient identified, site marked, surgical consent, pre-op evaluation, timeout performed, IV checked, risks and benefits discussed and monitors and equipment checked Spinal Block Patient position: sitting Prep: ChloraPrep and site prepped and draped Patient monitoring: heart rate, cardiac monitor, continuous pulse ox and blood pressure Approach: midline Location: L3-4 Injection technique: single-shot Needle Needle type: Pencan  Needle gauge: 24 G Needle length: 9 cm Needle insertion depth: 6 cm Assessment Sensory level: T8

## 2017-03-21 ENCOUNTER — Encounter (HOSPITAL_COMMUNITY): Payer: Self-pay | Admitting: Orthopaedic Surgery

## 2017-03-21 LAB — BASIC METABOLIC PANEL
Anion gap: 9 (ref 5–15)
BUN: 12 mg/dL (ref 6–20)
CO2: 23 mmol/L (ref 22–32)
Calcium: 8 mg/dL — ABNORMAL LOW (ref 8.9–10.3)
Chloride: 106 mmol/L (ref 101–111)
Creatinine, Ser: 1.37 mg/dL — ABNORMAL HIGH (ref 0.61–1.24)
GFR calc non Af Amer: 55 mL/min — ABNORMAL LOW (ref >60)
Glucose, Bld: 127 mg/dL — ABNORMAL HIGH (ref 65–99)
POTASSIUM: 4 mmol/L (ref 3.5–5.1)
SODIUM: 138 mmol/L (ref 135–145)

## 2017-03-21 LAB — CBC
HEMATOCRIT: 27.1 % — AB (ref 39.0–52.0)
Hemoglobin: 8.9 g/dL — ABNORMAL LOW (ref 13.0–17.0)
MCH: 27.8 pg (ref 26.0–34.0)
MCHC: 32.8 g/dL (ref 30.0–36.0)
MCV: 84.7 fL (ref 78.0–100.0)
Platelets: 195 10*3/uL (ref 150–400)
RBC: 3.2 MIL/uL — AB (ref 4.22–5.81)
RDW: 13.4 % (ref 11.5–15.5)
WBC: 9.7 10*3/uL (ref 4.0–10.5)

## 2017-03-21 NOTE — Evaluation (Signed)
Physical Therapy Evaluation Patient Details Name: Luis ReefRichard C Deiter MRN: [id number] DOB: [birth year] Today's Date: 03/21/2017   History of Present Illness  Pt is a 56 y.o. male now s/p elective R THA (direct anterior approach) on 03/20/17. Pertinent PMH includes asthma.  Clinical Impression  Pt presents with pain and an overall decrease in functional mobility secondary to above. PTA, pt indep and lives at home with fiance who will be available for 24/7 support if needed. Educ on precautions, positioning, therex, and importance of mobility. Today, pt able to transfer and amb 100' with RW and min guard for balance. Overall, pt moving very well despite pain. Pt would benefit from continued acute PT services to maximize functional mobility and independence prior to d/c with HHPT.     Follow Up Recommendations Home health PT;DC plan and follow up therapy as arranged by surgeon    Equipment Recommendations  Rolling walker with 5" wheels;3in1 (PT)    Recommendations for Other Services OT consult     Precautions / Restrictions Precautions Precautions: None Restrictions Weight Bearing Restrictions: Yes RLE Weight Bearing: Weight bearing as tolerated      Mobility  Bed Mobility Overal bed mobility: Needs Assistance Bed Mobility: Supine to Sit     Supine to sit: Supervision     General bed mobility comments: Increased time and effort, but no physical assist required. Educ on technique to use LLE to assist RLE to EOB if needed  Transfers Overall transfer level: Needs assistance Equipment used: Rolling walker (2 wheeled) Transfers: Sit to/from Stand Sit to Stand: Min guard         General transfer comment: Cues for hand placement on RW; poor eccentric control into sitting secondary to R hip pain  Ambulation/Gait Ambulation/Gait assistance: Min guard Ambulation Distance (Feet): 100 Feet Assistive device: Rolling walker (2 wheeled) Gait Pattern/deviations: Step-through  pattern;Decreased stride length;Decreased weight shift to right;Antalgic Gait velocity: Decreased Gait velocity interpretation: <1.8 ft/sec, indicative of risk for recurrent falls General Gait Details: Slow, antalgic gait with RW and min guard for balance; educ on step-through pattern and increased WBAT through RLE as able. Pt reports pain/stiffness improving with amb  Stairs            Wheelchair Mobility    Modified Rankin (Stroke Patients Only)       Balance Overall balance assessment: Needs assistance Sitting-balance support: No upper extremity supported;Feet supported;Feet unsupported Sitting balance-Leahy Scale: Good       Standing balance-Leahy Scale: Poor Standing balance comment: Reliant on BUE support                             Pertinent Vitals/Pain Pain Assessment: 0-10 Pain Score: 6  Pain Location: R hip Pain Descriptors / Indicators: Grimacing;Discomfort Pain Intervention(s): Monitored during session    Home Living Family/patient expects to be discharged to:: Private residence Living Arrangements: Spouse/significant other Soil scientist(Fiance) Available Help at Discharge: Available 24 hours/day;Family (Fiance) Type of Home: House Home Access: Stairs to enter Entrance Stairs-Rails: Right Entrance Stairs-Number of Steps: 4 Home Layout: One level Home Equipment: Cane - single point      Prior Function Level of Independence: Independent         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 Active with yardwork, mowing, etc.      Hand Dominance        Extremity/Trunk Assessment   Upper Extremity Assessment Upper Extremity Assessment: Overall [facility]  for tasks assessed    Lower Extremity Assessment Lower Extremity Assessment: RLE deficits/detail RLE Deficits / Details: s/p R THA; R hip flex grossly 3/5, knee flex/ext 4/5 RLE: Unable to fully assess due to pain    Cervical / Trunk Assessment Cervical /  Trunk Assessment: Normal  Communication   Communication: No difficulties  Cognition Arousal/Alertness: Awake/alert Behavior During Therapy: WFL for tasks assessed/performed Overall Cognitive Status: Within Functional Limits for tasks assessed                                        General Comments      Exercises Total Joint Exercises Ankle Circles/Pumps: AROM;Both;15 reps;Supine Quad Sets: AROM;Both;10 reps;Supine (10sec hold) Heel Slides: AROM;AAROM;Right;10 reps;Supine Long Arc Quad: AROM;Right;5 reps;Seated   Assessment/Plan    PT Assessment Patient needs continued PT services  PT Problem List Decreased strength;Decreased range of motion;Decreased activity tolerance;Decreased balance;Decreased mobility;Decreased knowledge of use of DME;Decreased knowledge of precautions;Pain       PT Treatment Interventions DME instruction;Gait training;Stair training;Functional mobility training;Therapeutic activities;Therapeutic exercise;Balance training;Patient/family education    PT Goals (Current goals can be found in the Care Plan section)  Acute Rehab PT Goals Patient Stated Goal: Return home with less pain PT Goal Formulation: With patient Time For Goal Achievement: 04/04/17 Potential to Achieve Goals: Good    Frequency 7X/week   Barriers to discharge        Co-evaluation               AM-PAC PT "6 Clicks" Daily Activity  Outcome Measure Difficulty turning over in bed (including adjusting bedclothes, sheets and blankets)?: A Little Difficulty moving from lying on back to sitting on the side of the bed? : A Little Difficulty sitting down on and standing up from a chair with arms (e.g., wheelchair, bedside commode, etc,.)?: A Little Help needed moving to and from a bed to chair (including a wheelchair)?: A Little Help needed walking in hospital room?: A Little Help needed climbing 3-5 steps with a railing? : A Little 6 Click Score: 18    End of  Session Equipment Utilized During Treatment: Gait belt Activity Tolerance: Patient tolerated treatment well Patient left: in chair;with call bell/phone within reach Nurse Communication: Mobility status PT Visit Diagnosis: Other abnormalities of gait and mobility (R26.89);Pain Pain - Right/Left: Right Pain - part of body: Hip    Time: 1610-9604 PT Time Calculation (min) (ACUTE ONLY): 26 min   Charges:   PT Evaluation $PT Eval Low Complexity: 1 Low PT Treatments $Gait Training: 8-22 mins   PT G Codes:       Ina Homes, PT, DPT Acute Rehab Services  Pager: [phone]  Malachy Chamber 03/21/2017, 8:25 AM

## 2017-03-21 NOTE — Progress Notes (Signed)
Subjective: 1 Day Post-Op Procedure(s) (LRB): RIGHT TOTAL HIP ARTHROPLASTY ANTERIOR APPROACH (Right) Patient reports pain as moderate.  Acute blood loss anemia from his surgery.  Objective: Vital signs in last 24 hours: Temp:  [97.8 F (36.6 C)-98.9 F (37.2 C)] 98.7 F (37.1 C) (10/26 0600) Pulse Rate:  [60-87] 75 (10/26 0600) Resp:  [12-18] 16 (10/26 0600) BP: (89-186)/(53-90) 104/58 (10/26 0600) SpO2:  [93 %-100 %] 95 % (10/26 0600) Weight:  [260 lb (117.9 kg)] 260 lb (117.9 kg) (10/25 0826)  Intake/Output from previous day: 10/25 0701 - 10/26 0700 In: 2431.3 [P.O.:240; I.V.:1941.3; IV Piggyback:250] Out: 1900 [Urine:950; Blood:950] Intake/Output this shift: Total I/O In: 881.3 [P.O.:240; I.V.:641.3] Out: 950 [Urine:950]   Recent Labs  03/21/17 0453  HGB 8.9*    Recent Labs  03/21/17 0453  WBC 9.7  RBC 3.20*  HCT 27.1*  PLT 195    Recent Labs  03/21/17 0453  NA 138  K 4.0  CL 106  CO2 23  BUN 12  CREATININE 1.37*  GLUCOSE 127*  CALCIUM 8.0*   No results for input(s): LABPT, INR in the last 72 hours.  Sensation intact distally Intact pulses distally Dorsiflexion/Plantar flexion intact Incision: scant drainage  Assessment/Plan: 1 Day Post-Op Procedure(s) (LRB): RIGHT TOTAL HIP ARTHROPLASTY ANTERIOR APPROACH (Right) Up with therapy  Kathryne HitchChristopher Y Kyrin Garn 03/21/2017, 6:53 AM
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
## 2017-03-21 NOTE — Evaluation (Addendum)
Occupational Therapy Evaluation Patient Details Name: [name] MRN: [id number] DOB: [birth year] Today's Date: 03/21/2017    History of Present Illness Pt is a 56 y.o. male now s/p elective R THA (direct anterior approach) on 03/20/17. Pertinent PMH includes asthma.   Clinical Impression   Pt admitted with the above diagnoses and presents with below problem list. Pt will benefit from continued acute OT to address the below listed deficits and maximize independence with basic ADLs prior to d/c home. PTA pt was independent with ADLs. Pt is currently min guard for functional transfers and mobility, min guard-min A with LB ADLs.       Follow Up Recommendations  No OT follow up    Equipment Recommendations  3 in 1 bedside commode    Recommendations for Other Services       Precautions / Restrictions Precautions Precautions: None Restrictions Weight Bearing Restrictions: Yes RLE Weight Bearing: Weight bearing as tolerated      Mobility Bed Mobility Overal bed mobility: Needs Assistance Bed Mobility: Supine to Sit     Supine to sit: Supervision     General bed mobility comments: up in chair  Transfers Overall transfer level: Needs assistance Equipment used: Rolling walker (2 wheeled) Transfers: Sit to/from Stand Sit to Stand: Min guard         General transfer comment: Cues for hand placement on RW; min guard to steady. from 3n1 and recliner    Balance Overall balance assessment: Needs assistance Sitting-balance support: No upper extremity supported;Feet supported;Feet unsupported Sitting balance-Leahy Scale: Good       Standing balance-Leahy Scale: Poor Standing balance comment: Reliant on BUE support                           ADL either performed or assessed with clinical judgement   ADL Overall ADL's : Needs assistance/impaired Eating/Feeding: Set up;Sitting   Grooming: Min guard;Standing   Upper Body Bathing: Set up;Sitting    Lower Body Bathing: Sit to/from stand;Minimal assistance   Upper Body Dressing : Set up;Sitting   Lower Body Dressing: Minimal assistance;Sit to/from stand   Toilet Transfer: Min guard;Ambulation;RW (3n1 over toilet)   Toileting- Clothing Manipulation and Hygiene: Min guard;Sit to/from stand   Tub/ Shower Transfer: Tub transfer;Minimal assistance;Min guard;Ambulation;3 in 1;Rolling walker Tub/Shower Transfer Details (indicate cue type and reason): provided handout on 3n1 as shower seat and reviewed. plan to practice next session.  Functional mobility during ADLs: Min guard;Rolling walker General ADL Comments: Pt completed in-room functional mobility, toilet transfer, and UB/partial LB bathing at sink as detailed above. Pt likely with difficulty accessing R foot in seated position.      Vision         Perception     Praxis      Pertinent Vitals/Pain Pain Assessment: Faces Pain Score: 6  Faces Pain Scale: Hurts little more Pain Location: R hip Pain Descriptors / Indicators: Grimacing;Discomfort Pain Intervention(s): Monitored during session;Repositioned;Premedicated before session     Hand Dominance     Extremity/Trunk Assessment Upper Extremity Assessment Upper Extremity Assessment: Overall WFL for tasks assessed   Lower Extremity Assessment Lower Extremity Assessment: Defer to PT evaluation RLE Deficits / Details: s/p R THA; R hip flex grossly 3/5, knee flex/ext 4/5 RLE: Unable to fully assess due to pain   Cervical / Trunk Assessment Cervical / Trunk Assessment: Normal   Communication Communication Communication: No difficulties   Cognition Arousal/Alertness: Awake/alert Behavior During Therapy: [facility]  for tasks assessed/performed Overall Cognitive Status: Within Functional Limits for tasks assessed                                 General Comments: .   General Comments  Pt reporting some nausea upon therapist arrival. Nursing notified and gave IV  med during session. Pt reported feeling better at end of session.    Exercises Total Joint Exercises Ankle Circles/Pumps: AROM;Both;15 reps;Supine Quad Sets: AROM;Both;10 reps;Supine (10sec hold) Heel Slides: AROM;AAROM;Right;10 reps;Supine Long Arc Quad: AROM;Right;5 reps;Seated   Shoulder Instructions      Home Living Family/patient expects to be discharged to:: Private residence Living Arrangements: Spouse/significant other (fiance) Available Help at Discharge: Available 24 hours/day;Family Type of Home: House Home Access: Stairs to enter Entergy CorporationEntrance Stairs-Number of Steps: 4 Entrance Stairs-Rails: Right Home Layout: One level     Bathroom Shower/Tub: Chief Strategy OfficerTub/shower unit   Bathroom Toilet: Standard     Home Equipment: Cane - single point          Prior Functioning/Environment Level of Independence: Independent        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 Active with yardwork, mowing, etc.         OT Problem List: Impaired balance (sitting and/or standing);Decreased knowledge of use of DME or AE;Decreased knowledge of precautions;Pain      OT Treatment/Interventions: Self-care/ADL training;DME and/or AE instruction;Therapeutic activities;Balance training;Patient/family education    OT Goals(Current goals can be found in the care plan section) Acute Rehab OT Goals Patient Stated Goal: Return home with less pain OT Goal Formulation: With patient Time For Goal Achievement: 03/28/17 Potential to Achieve Goals: Good ADL Goals Pt Will Perform Lower Body Bathing: with modified independence;with adaptive equipment;sit to/from stand Pt Will Perform Lower Body Dressing: with modified independence;with adaptive equipment;sit to/from stand Pt Will Perform Tub/Shower Transfer: Tub transfer;with supervision;ambulating;3 in 1;rolling walker  OT Frequency: Min 2X/week   Barriers to D/C:            Co-evaluation               AM-PAC PT "6 Clicks" Daily Activity     Outcome Measure Help from another person eating meals?: None Help from another person taking care of personal grooming?: A Little Help from another person toileting, which includes using toliet, bedpan, or urinal?: A Little Help from another person bathing (including washing, rinsing, drying)?: A Little Help from another person to put on and taking off regular upper body clothing?: None Help from another person to put on and taking off regular lower body clothing?: A Little 6 Click Score: 20   End of Session Equipment Utilized During Treatment: Rolling walker Nurse Communication: Other (comment) (nausea)  Activity Tolerance: Patient tolerated treatment well;Other (comment) (nausea upon OT arrival, suspect med-related) Patient left: in chair;with call bell/phone within reach  OT Visit Diagnosis: Unsteadiness on feet (R26.81);Pain Pain - Right/Left: Right Pain - part of body: Hip                Time: [id number] OT Time Calculation (min): 28 min Charges:  OT General Charges $OT Visit: 1 Visit OT Evaluation $OT Eval Low Complexity: 1 Low OT Treatments $Self Care/Home Management : 8-22 mins G-Codes:       Pilar GrammesMathews, Bennye Nix H 03/21/2017, 10:41 AM

## 2017-03-21 NOTE — Anesthesia Postprocedure Evaluation (Signed)
Anesthesia Post Note  Patient: Fanny Bienichard C Gressman  Procedure(s) Performed: RIGHT TOTAL HIP ARTHROPLASTY ANTERIOR APPROACH (Right Hip)     Patient location during evaluation: PACU Anesthesia Type: Spinal Level of consciousness: awake and alert Pain management: pain level controlled Vital Signs Assessment: post-procedure vital signs reviewed and stable Respiratory status: spontaneous breathing, nonlabored ventilation, respiratory function stable and patient connected to nasal cannula oxygen Cardiovascular status: blood pressure returned to baseline and stable Postop Assessment: no apparent nausea or vomiting Anesthetic complications: no    Last Vitals:  Vitals:   03/21/17 0125 03/21/17 0600  BP: 101/63 (!) 104/58  Pulse: 87 75  Resp: 16 16  Temp: 37.2 C 37.1 C  SpO2: 100% 95%    Last Pain:  Vitals:   03/21/17 0600  TempSrc: Oral  PainSc:                  [place] Marney

## 2017-03-21 NOTE — Progress Notes (Signed)
Physical Therapy Treatment Patient Details Name: [name] MRN: [id number] DOB: [birth year] Today's Date: 03/21/2017    History of Present Illness Pt is a 56 y.o. male now s/p elective R THA (direct anterior approach) on 03/20/17. Pertinent PMH includes asthma.   PT Comments    Pt progressing well with mobility. Amb 39' with RW and supervision for safety; initiated stair training with bilateral rails and supervision. Reviewed therex, positioning and precautions. Overall, pt is moving very well. Will plan for continued gait training tomorrow to maximize functional mobility and independence prior to d/c home.    Follow Up Recommendations  Home health PT;DC plan and follow up therapy as arranged by surgeon     Equipment Recommendations  Rolling walker with 5" wheels;3in1 (PT)    Recommendations for Other Services       Precautions / Restrictions Precautions Precautions: None Restrictions Weight Bearing Restrictions: Yes RLE Weight Bearing: Weight bearing as tolerated    Mobility  Bed Mobility Overal bed mobility: Needs Assistance Bed Mobility: Sit to Supine       Sit to supine: Modified independent (Device/Increase time)   General bed mobility comments: Increased time and effort; able to use LLE to assist RLE into bed  Transfers Overall transfer level: Needs assistance Equipment used: Rolling walker (2 wheeled) Transfers: Sit to/from Stand Sit to Stand: Supervision         General transfer comment: Good technique with RW  Ambulation/Gait Ambulation/Gait assistance: Supervision Ambulation Distance (Feet): 230 Feet Assistive device: Rolling walker (2 wheeled) Gait Pattern/deviations: Step-through pattern;Decreased stride length;Decreased weight shift to right;Antalgic Gait velocity: Decreased Gait velocity interpretation: <1.8 ft/sec, indicative of risk for recurrent falls General Gait Details: Slow, controlled gait with RW and supervision. Intermittent  cues for upright posture and heel-to-toe gait pattern.    Stairs Stairs: Yes   Stair Management: One rail Right;Step to pattern;Forwards;Two rails Number of Stairs: 5 General stair comments: Ascend/descend 5 steps with supervision and cues for technique  Wheelchair Mobility    Modified Rankin (Stroke Patients Only)       Balance Overall balance assessment: Needs assistance Sitting-balance support: No upper extremity supported;Feet supported;Feet unsupported Sitting balance-Leahy Scale: Good       Standing balance-Leahy Scale: Fair Standing balance comment: Able to static stand with no UE support                            Cognition Arousal/Alertness: Awake/alert Behavior During Therapy: WFL for tasks assessed/performed Overall Cognitive Status: Within Functional Limits for tasks assessed                                        Exercises Total Joint Exercises Hip ABduction/ADduction: AAROM;Right;5 reps Straight Leg Raises: AAROM;Right;5 reps Long Arc Quad: AROM;Both;20 reps;Seated    General Comments        Pertinent Vitals/Pain Pain Assessment: Faces Faces Pain Scale: Hurts a little bit Pain Location: R hip Pain Descriptors / Indicators: Sore Pain Intervention(s): Monitored during session;Ice applied    Home Living                      Prior Function            PT Goals (current goals can now be found in the care plan section) Acute Rehab PT Goals Patient Stated Goal: Return home with less  pain PT Goal Formulation: With patient Time For Goal Achievement: 04/04/17 Potential to Achieve Goals: Good Progress towards PT goals: Progressing toward goals    Frequency    7X/week      PT Plan Current plan remains appropriate    Co-evaluation              AM-PAC PT "6 Clicks" Daily Activity  Outcome Measure  Difficulty turning over in bed (including adjusting bedclothes, sheets and blankets)?: A  Little Difficulty moving from lying on back to sitting on the side of the bed? : A Little Difficulty sitting down on and standing up from a chair with arms (e.g., wheelchair, bedside commode, etc,.)?: A Little Help needed moving to and from a bed to chair (including a wheelchair)?: A Little Help needed walking in hospital room?: A Little Help needed climbing 3-5 steps with a railing? : A Little 6 Click Score: 18    End of Session Equipment Utilized During Treatment: Gait belt Activity Tolerance: Patient tolerated treatment well Patient left: in bed;with call bell/phone within reach;with family/visitor present Nurse Communication: Mobility status PT Visit Diagnosis: Other abnormalities of gait and mobility (R26.89);Pain Pain - Right/Left: Right Pain - part of body: Hip     Time: [id number] PT Time Calculation (min) (ACUTE ONLY): 21 min  Charges:  $Gait Training: 8-22 mins                    G Codes:      Ina HomesJaclyn Geral Tuch, PT, DPT Acute Rehab Services  Pager: [phone]  Malachy ChamberJaclyn L Eiliana Drone 03/21/2017, 2:24 PM

## 2017-03-22 DIAGNOSIS — D62 Acute posthemorrhagic anemia: Secondary | ICD-10-CM | POA: Diagnosis not present

## 2017-03-22 LAB — CBC
HCT: 28.1 % — ABNORMAL LOW (ref 39.0–52.0)
HEMOGLOBIN: 9.2 g/dL — AB (ref 13.0–17.0)
MCH: 27.5 pg (ref 26.0–34.0)
MCHC: 32.7 g/dL (ref 30.0–36.0)
MCV: 83.9 fL (ref 78.0–100.0)
Platelets: 179 10*3/uL (ref 150–400)
RBC: 3.35 MIL/uL — AB (ref 4.22–5.81)
RDW: 13.1 % (ref 11.5–15.5)
WBC: 8 10*3/uL (ref 4.0–10.5)

## 2017-03-22 MED ORDER — GABAPENTIN 100 MG PO CAPS: 100.0000 mg | ORAL_CAPSULE | Freq: Three times a day (TID) | ORAL | 3 refills | Status: DC

## 2017-03-22 MED ORDER — ASPIRIN 81 MG PO CHEW: 81.0000 mg | CHEWABLE_TABLET | Freq: Two times a day (BID) | ORAL | 3 refills | Status: DC

## 2017-03-22 MED ORDER — METHOCARBAMOL 500 MG PO TABS: 500.0000 mg | ORAL_TABLET | Freq: Four times a day (QID) | ORAL | 1 refills | Status: DC | PRN

## 2017-03-22 MED ORDER — OXYCODONE HCL 10 MG PO TABS: 10.0000 mg | ORAL_TABLET | ORAL | 0 refills | Status: DC | PRN

## 2017-03-22 MED ORDER — DOCUSATE SODIUM 100 MG PO CAPS: 100.0000 mg | ORAL_CAPSULE | Freq: Two times a day (BID) | ORAL | 0 refills | Status: DC

## 2017-03-22 NOTE — Progress Notes (Signed)
Occupational Therapy Treatment Patient Details Name: Luis ReefRichard C Webster MRN: [id number] DOB: 03/11/1961 Today's Date: 03/22/2017    History of present illness Pt is a 56 y.o. male now s/p elective R THA (direct anterior approach) on 03/20/17. Pertinent PMH includes asthma.   OT comments  Pt. Able to complete bed mobility and toileting declines tub transfer and states he wants to sponge bathe initially.  D/c home likely later today.  Follow Up Recommendations  No OT follow up    Equipment Recommendations  3 in 1 bedside commode    Recommendations for Other Services      Precautions / Restrictions Precautions Precautions: None Restrictions RLE Weight Bearing: Weight bearing as tolerated       Mobility Bed Mobility Overal bed mobility: Needs Assistance Bed Mobility: Supine to Sit     Supine to sit: Supervision     General bed mobility comments: Increased time and effort; able to use LLE to assist RLE into bed, hob flat no rail exits on the L side  Transfers Overall transfer level: Needs assistance Equipment used: Rolling walker (2 wheeled) Transfers: Sit to/from Stand Sit to Stand: Supervision         General transfer comment: Good technique with RW    Balance                                           ADL either performed or assessed with clinical judgement   ADL Overall ADL's : Needs assistance/impaired                         Toilet Transfer: Firefighterupervision/safety;RW Toilet Transfer Details (indicate cue type and reason): pt. was seated and stood for urinal use Toileting- ArchitectClothing Manipulation and Hygiene: Supervision/safety;Sit to/from Nurse, children'sstand     Tub/Shower Transfer Details (indicate cue type and reason): pt. declined states he does not feel ready for tub transfer side step and will sponge bathe initially, educated not to attempt tub transfer without assistance Functional mobility during ADLs: SolicitorMin guard;Rolling walker        Vision       Perception     Praxis      Cognition                                                Exercises     Shoulder Instructions       General Comments      Pertinent Vitals/ Pain       Pain Assessment: No/denies pain  Home Living                                          Prior Functioning/Environment              Frequency  Min 2X/week        Progress Toward Goals  OT Goals(current goals can now be found in the care plan section)  Progress towards OT goals: Progressing toward goals     Plan      Co-evaluation                 AM-PAC PT "6  Clicks" Daily Activity     Outcome Measure   Help from another person eating meals?: None Help from another person taking care of personal grooming?: A Little Help from another person toileting, which includes using toliet, bedpan, or urinal?: A Little Help from another person bathing (including washing, rinsing, drying)?: A Little Help from another person to put on and taking off regular upper body clothing?: None Help from another person to put on and taking off regular lower body clothing?: A Little 6 Click Score: 20    End of Session Equipment Utilized During Treatment: Rolling walker  OT Visit Diagnosis: Unsteadiness on feet (R26.81);Pain Pain - Right/Left: Right Pain - part of body: Hip   Activity Tolerance Patient tolerated treatment well;Other (comment)   Patient Left in chair;with call bell/phone within reach   Nurse Communication          Time: 1001-1012 OT Time Calculation (min): 11 min  Charges: OT General Charges $OT Visit: 1 Visit OT Treatments $Self Care/Home Management : 8-22 mins   Robet Leu, COTA/L 03/22/2017, 10:33 AM

## 2017-03-22 NOTE — Discharge Instructions (Signed)
Keep hip incision dry for 5 days post op then may wet while bathing. °Therapy daily . °Call if fever or chills or increased drainage. °Go to ER if acutely short of breath or call for ambulance. °Return for follow up in 2 weeks. °May full weight bear on the surgical leg unless told otherwise. °In house walking for first 2 weeks.  ° °

## 2017-03-22 NOTE — Progress Notes (Addendum)
     Subjective: 2 Days Post-Op Procedure(s) (LRB): RIGHT TOTAL HIP ARTHROPLASTY ANTERIOR APPROACH (Right) Awake, alert and oriented x 4. I am ready to go today. No chills. Patient reports pain as moderate.    Objective:   VITALS:  Temp:  [98.6 F (37 C)-100.2 F (37.9 C)] 100.2 F (37.9 C) (10/27 0650) Pulse Rate:  [80-91] 80 (10/27 0650) Resp:  [17] 17 (10/27 0650) BP: (98-132)/(38-74) 98/38 (10/27 0650) SpO2:  [92 %-96 %] 92 % (10/27 0650)  Neurologically intact ABD soft Neurovascular intact Sensation intact distally Intact pulses distally Dorsiflexion/Plantar flexion intact Incision: scant drainage No cellulitis present Compartment soft   LABS  Recent Labs  03/21/17 0453 03/22/17 0635  HGB 8.9* 9.2*  WBC 9.7 8.0  PLT 195 179    Recent Labs  03/21/17 0453  NA 138  K 4.0  CL 106  CO2 23  BUN 12  CREATININE 1.37*  GLUCOSE 127*   No results for input(s): LABPT, INR in the last 72 hours.   Assessment/Plan: 2 Days Post-Op Procedure(s) (LRB): RIGHT TOTAL HIP ARTHROPLASTY ANTERIOR APPROACH (Right) Anemia periop  Advance diet Up with therapy Discharge home with home health  [name] 03/22/2017, 9:13 AM Patient ID: [name], male   DOB: [birth year], 58 y.o.   MRN: [id number]

## 2017-03-22 NOTE — Progress Notes (Signed)
PT Cancellation Note  Patient Details Name: Roselyn ReefRichard C Ruderman MRN: [id number] DOB: 1960-08-12   Cancelled Treatment:    Reason Eval/Treat Not Completed: Other (comment). States he is dressing to leave and has accomplished stair training and longer gait with RW.  Will check later to see if he is still here.   Ivar DrapeRuth E Wymon Swaney 03/22/2017, 10:44 AM   Samul Dadauth Jameela Michna, PT MS Acute Rehab Dept. Number: [facility] [phone] and [facility] [phone]

## 2017-03-22 NOTE — Care Management Note (Signed)
Case Management Note  Patient Details  Name: Roselyn ReefRichard C Vinsant MRN: [id number] Date of Birth: 1960/07/20  Subjective/Objective:  CM confirmed with pt that he will be [facility]. Made Reggie, with [facility] aware pt will need 3n1 and RW prior to discharge today. No other needs at this time.                   Action/Plan: CM will sign off for now but will be available should additional discharge needs arise or disposition change.    Expected Discharge Date:  03/22/17               Expected Discharge Plan:     In-House Referral:     Discharge planning Services     Post Acute Care Choice:    Choice offered to:     DME Arranged:    DME Agency:     HH Arranged:    HH Agency:     Status of Service:     If discussed at [employer] Meetings, dates discussed:    Additional Comments:  Yvone NeuCrutchfield, Avaiyah Strubel M, RN 03/22/2017, 9:25 AM

## 2017-03-23 DIAGNOSIS — Z6841 Body Mass Index (BMI) 40.0 and over, adult: Secondary | ICD-10-CM | POA: Diagnosis not present

## 2017-03-23 DIAGNOSIS — E669 Obesity, unspecified: Secondary | ICD-10-CM | POA: Diagnosis not present

## 2017-03-23 DIAGNOSIS — Z96641 Presence of right artificial hip joint: Secondary | ICD-10-CM | POA: Diagnosis not present

## 2017-03-23 DIAGNOSIS — J45909 Unspecified asthma, uncomplicated: Secondary | ICD-10-CM | POA: Diagnosis not present

## 2017-03-23 DIAGNOSIS — Z9181 History of falling: Secondary | ICD-10-CM | POA: Diagnosis not present

## 2017-03-23 DIAGNOSIS — Z471 Aftercare following joint replacement surgery: Secondary | ICD-10-CM | POA: Diagnosis not present

## 2017-03-24 ENCOUNTER — Other Ambulatory Visit (INDEPENDENT_AMBULATORY_CARE_PROVIDER_SITE_OTHER): Payer: Self-pay | Admitting: Specialist

## 2017-03-24 ENCOUNTER — Telehealth (INDEPENDENT_AMBULATORY_CARE_PROVIDER_SITE_OTHER): Payer: Self-pay | Admitting: Orthopaedic Surgery

## 2017-03-24 MED ORDER — TIZANIDINE HCL 2 MG PO CAPS
2.0000 mg | ORAL_CAPSULE | Freq: Three times a day (TID) | ORAL | 1 refills | Status: DC | PRN
Start: 2017-03-24 — End: 2017-07-21

## 2017-03-24 NOTE — Telephone Encounter (Signed)
[name] asking for verbal approval of 3 week 2 for Home Health PT. CB # [phone]

## 2017-03-24 NOTE — Telephone Encounter (Signed)
Verbal orders given  

## 2017-03-25 DIAGNOSIS — E669 Obesity, unspecified: Secondary | ICD-10-CM | POA: Diagnosis not present

## 2017-03-25 DIAGNOSIS — Z6841 Body Mass Index (BMI) 40.0 and over, adult: Secondary | ICD-10-CM | POA: Diagnosis not present

## 2017-03-25 DIAGNOSIS — Z9181 History of falling: Secondary | ICD-10-CM | POA: Diagnosis not present

## 2017-03-25 DIAGNOSIS — J45909 Unspecified asthma, uncomplicated: Secondary | ICD-10-CM | POA: Diagnosis not present

## 2017-03-25 DIAGNOSIS — Z471 Aftercare following joint replacement surgery: Secondary | ICD-10-CM | POA: Diagnosis not present

## 2017-03-25 DIAGNOSIS — Z96641 Presence of right artificial hip joint: Secondary | ICD-10-CM | POA: Diagnosis not present

## 2017-03-27 DIAGNOSIS — Z9181 History of falling: Secondary | ICD-10-CM | POA: Diagnosis not present

## 2017-03-27 DIAGNOSIS — Z471 Aftercare following joint replacement surgery: Secondary | ICD-10-CM | POA: Diagnosis not present

## 2017-03-27 DIAGNOSIS — Z96641 Presence of right artificial hip joint: Secondary | ICD-10-CM | POA: Diagnosis not present

## 2017-03-27 DIAGNOSIS — Z6841 Body Mass Index (BMI) 40.0 and over, adult: Secondary | ICD-10-CM | POA: Diagnosis not present

## 2017-03-27 DIAGNOSIS — J45909 Unspecified asthma, uncomplicated: Secondary | ICD-10-CM | POA: Diagnosis not present

## 2017-03-27 DIAGNOSIS — E669 Obesity, unspecified: Secondary | ICD-10-CM | POA: Diagnosis not present

## 2017-03-31 DIAGNOSIS — E669 Obesity, unspecified: Secondary | ICD-10-CM | POA: Diagnosis not present

## 2017-03-31 DIAGNOSIS — J45909 Unspecified asthma, uncomplicated: Secondary | ICD-10-CM | POA: Diagnosis not present

## 2017-03-31 DIAGNOSIS — Z6841 Body Mass Index (BMI) 40.0 and over, adult: Secondary | ICD-10-CM | POA: Diagnosis not present

## 2017-03-31 DIAGNOSIS — Z471 Aftercare following joint replacement surgery: Secondary | ICD-10-CM | POA: Diagnosis not present

## 2017-03-31 DIAGNOSIS — Z9181 History of falling: Secondary | ICD-10-CM | POA: Diagnosis not present

## 2017-03-31 DIAGNOSIS — Z96641 Presence of right artificial hip joint: Secondary | ICD-10-CM | POA: Diagnosis not present

## 2017-04-02 DIAGNOSIS — Z6841 Body Mass Index (BMI) 40.0 and over, adult: Secondary | ICD-10-CM | POA: Diagnosis not present

## 2017-04-02 DIAGNOSIS — Z9181 History of falling: Secondary | ICD-10-CM | POA: Diagnosis not present

## 2017-04-02 DIAGNOSIS — Z471 Aftercare following joint replacement surgery: Secondary | ICD-10-CM | POA: Diagnosis not present

## 2017-04-02 DIAGNOSIS — E669 Obesity, unspecified: Secondary | ICD-10-CM | POA: Diagnosis not present

## 2017-04-02 DIAGNOSIS — J45909 Unspecified asthma, uncomplicated: Secondary | ICD-10-CM | POA: Diagnosis not present

## 2017-04-02 DIAGNOSIS — Z96641 Presence of right artificial hip joint: Secondary | ICD-10-CM | POA: Diagnosis not present

## 2017-04-04 DIAGNOSIS — E669 Obesity, unspecified: Secondary | ICD-10-CM | POA: Diagnosis not present

## 2017-04-04 DIAGNOSIS — Z9181 History of falling: Secondary | ICD-10-CM | POA: Diagnosis not present

## 2017-04-04 DIAGNOSIS — Z471 Aftercare following joint replacement surgery: Secondary | ICD-10-CM | POA: Diagnosis not present

## 2017-04-04 DIAGNOSIS — Z6841 Body Mass Index (BMI) 40.0 and over, adult: Secondary | ICD-10-CM | POA: Diagnosis not present

## 2017-04-04 DIAGNOSIS — Z96641 Presence of right artificial hip joint: Secondary | ICD-10-CM | POA: Diagnosis not present

## 2017-04-04 DIAGNOSIS — J45909 Unspecified asthma, uncomplicated: Secondary | ICD-10-CM | POA: Diagnosis not present

## 2017-04-07 ENCOUNTER — Ambulatory Visit (INDEPENDENT_AMBULATORY_CARE_PROVIDER_SITE_OTHER): Payer: Medicare Other | Admitting: Orthopaedic Surgery

## 2017-04-07 ENCOUNTER — Encounter (INDEPENDENT_AMBULATORY_CARE_PROVIDER_SITE_OTHER): Payer: Self-pay | Admitting: Orthopaedic Surgery

## 2017-04-07 DIAGNOSIS — Z96641 Presence of right artificial hip joint: Secondary | ICD-10-CM

## 2017-04-07 NOTE — Progress Notes (Signed)
The patient is now 18 days status post a right total hip arthroplasty through direct anterior approach.  He says he is doing much better overall than what he did before surgery in terms of the pain he was having his hip and his mobility issues.  He still walking around with a cane.  He does have pain and swelling in that hip.  Has been on aspirin twice a day.  He says he does not need pain medication today.  On examination his staples been removed and Steri-Strips applied on the right hip incision.  I see no significant issues with the incision itself.  He does have a large seroma and I was able to drain 110 cc of seroma fluid from around his hip with no evidence of infection.  He tolerated this well.  At this standpoint to continue to increase his activities.  I do not to see him back 4 weeks unless he is having issues at all.  All questions and concerns were answered and addressed.  Obviously these have been positive for than he knows to come see us

## 2017-04-08 NOTE — Discharge Summary (Signed)
Patient ID: Luis ReefRichard C Montz MRN: [id number] DOB/AGE: 31-Aug-1960 56 y.o.  Admit date: 03/20/2017 Discharge date: 04/08/2017  Admission Diagnoses:  Principal Problem:   Traumatic arthritis of right hip Active Problems:   Status post total replacement of right hip   Anemia associated with acute blood loss   Discharge Diagnoses:  Same  Past Medical History:  Diagnosis Date  . Asthma     Surgeries: Procedure(s): RIGHT TOTAL HIP ARTHROPLASTY ANTERIOR APPROACH on 03/20/2017   Consultants:   Discharged Condition: Improved  Hospital Course: [name] is an 56 y.o. male who was admitted 03/20/2017 for operative treatment ofTraumatic arthritis of right hip. Patient has severe unremitting pain that affects sleep, daily activities, and work/hobbies. After pre-op clearance the patient was taken to the operating room on 03/20/2017 and underwent  Procedure(s): RIGHT TOTAL HIP ARTHROPLASTY ANTERIOR APPROACH.    Patient was given perioperative antibiotics:  Anti-infectives (From admission, onward)   Start     Dose/Rate Route Frequency Ordered Stop   03/20/17 2030  ceFAZolin (ANCEF) IVPB 2g/100 mL premix     2 g 200 mL/hr over 30 Minutes Intravenous Every 6 hours 03/20/17 1819 03/21/17 0353   03/20/17 1000  ceFAZolin (ANCEF) 3 g in dextrose 5 % 50 mL IVPB     3 g 130 mL/hr over 30 Minutes Intravenous To [facility] 03/19/17 0905 03/20/17 1452       Patient was given sequential compression devices, early ambulation, and chemoprophylaxis to prevent DVT.  Patient benefited maximally from hospital stay and there were no complications.    Recent vital signs: No data found.   Recent laboratory studies: No results for input(s): WBC, HGB, HCT, PLT, NA, K, CL, CO2, BUN, CREATININE, GLUCOSE, INR, CALCIUM in the last 72 hours.  Invalid input(s): PT, 2   Discharge Medications:   Allergies as of 03/22/2017      Reactions   Aspirin Nausea Only      Medication List     STOP taking these medications   oxyCODONE-acetaminophen 10-325 MG tablet Commonly known as:  PERCOCET     TAKE these medications   aspirin 81 MG chewable tablet Chew 1 tablet (81 mg total) by mouth 2 (two) times daily.   docusate sodium 100 MG capsule Commonly known as:  COLACE Take 1 capsule (100 mg total) by mouth 2 (two) times daily.   gabapentin 100 MG capsule Commonly known as:  NEURONTIN Take 1 capsule (100 mg total) by mouth 3 (three) times daily.   methocarbamol 500 MG tablet Commonly known as:  ROBAXIN Take 1 tablet (500 mg total) by mouth every 6 (six) hours as needed for muscle spasms.   naproxen sodium 220 MG tablet Commonly known as:  ALEVE Take 440 mg by mouth daily as needed (pain).   Oxycodone HCl 10 MG Tabs Take 1-1.5 tablets (10-15 mg total) by mouth every 4 (four) hours as needed for severe pain ((score 7 to 10)).            Discharge Care Instructions  (From admission, onward)        Start     Ordered   03/22/17 0000  Change dressing    Comments:  Do not change your dressing.   03/22/17 [id number]      Diagnostic Studies: Dg Pelvis Portable  Result Date: 03/20/2017 CLINICAL DATA:  Status post hip replacement EXAM: PORTABLE PELVIS 1-2 VIEWS COMPARISON:  02/25/2017 FINDINGS: Cutaneous staples and soft tissue gas over the right lateral hip. Interval right  hip replacement with normal alignment. Surgical plate and fixating screws over the right acetabulum. IMPRESSION: Interval right hip replacement with expected postsurgical changes. Electronically Signed   By: Jasmine PangKim  Fujinaga M.D.   On: 03/20/2017 19:43   Dg C-arm 61-120 Min  Result Date: 03/20/2017 CLINICAL DATA:  Right total hip arthroplasty EXAM: DG C-ARM 61-120 MIN COMPARISON:  Pelvis film of 02/25/2017 FINDINGS: C-arm fluoroscopy was provided during right total hip replacement. Fluoroscopy time of 44 seconds was recorded. IMPRESSION: C-arm fluoroscopy provided during right total hip replacement.  Electronically Signed   By: Dwyane DeePaul  Barry M.D.   On: 03/20/2017 16:54   Dg Hip Operative Unilat With Pelvis Right  Result Date: 03/20/2017 CLINICAL DATA:  Right total hip replacement EXAM: OPERATIVE right HIP (WITH PELVIS IF PERFORMED) 7 VIEWS TECHNIQUE: Fluoroscopic spot image(s) were submitted for interpretation post-operatively. COMPARISON:  Films of the pelvis of 02/25/2017 FINDINGS: C-arm spot films show previous fixation of right acetabular fracture with plate and screw fixation. Later views show placement of femoral and acetabular components of right total hip replacement in good position. No complicating features are noted on the images obtained. IMPRESSION: Right total hip replacement.  No complicating features. Electronically Signed   By: Dwyane DeePaul  Barry M.D.   On: 03/20/2017 16:56    Disposition: 01-Home or Self Care  Discharge Instructions    Call MD / Call 911   Complete by:  As directed    If you experience chest pain or shortness of breath, CALL 911 and be transported to the hospital emergency room.  If you develope a fever above 101 F, pus (white drainage) or increased drainage or redness at the wound, or calf pain, call your surgeon's office.   Change dressing   Complete by:  As directed    Do not change your dressing.   Constipation Prevention   Complete by:  As directed    Drink plenty of fluids.  Prune juice may be helpful.  You may use a stool softener, such as Colace (over the counter) 100 mg twice a day.  Use MiraLax (over the counter) for constipation as needed.   Diet - low sodium heart healthy   Complete by:  As directed    Discharge instructions   Complete by:  As directed    Keep hip incision dry for 5 days post op then may wet while bathing. Therapy daily . Call if fever or chills or increased drainage. Go to ER if acutely short of breath or call for ambulance. Return for follow up in 2 weeks. May full weight bear on the surgical leg unless told otherwise. In  house walking for first 2 weeks.  Take baby aspirin every day.   Driving restrictions   Complete by:  As directed    No driving for 3 weeks   Increase activity slowly as tolerated   Complete by:  As directed    Lifting restrictions   Complete by:  As directed    No lifting for 6 weeks      Follow-up Information    Kathryne HitchBlackman, Christopher Y, MD. Schedule an appointment as soon as possible for a visit in 2 week(s).   Specialty:  Orthopedic Surgery Contact information: [address] [place] [address] [phone]        Kathryne HitchBlackman, Christopher Y, MD Follow up.   Specialty:  Orthopedic Surgery Contact information: [address] [place] [address] [phone]        [facility] Follow up.  Specialty:  Home Health Services Why:  HHPT will be provided by the above agency as arranged in your Orthopedic Surgeons office; Someone from that agency should contact you within 24 hours of discharge.  Contact information: [address] [place] [address] [place] [address] [phone]        [facility] - Dme Follow up.   Why:  RW and 3n1 will be delivered to your [place] room prior to discharge Contact information: [address] [place] [address] [phone]            Signed: Richardean Webster 04/08/2017, 2:56 PM

## 2017-04-11 ENCOUNTER — Emergency Department (HOSPITAL_COMMUNITY)
Admission: EM | Admit: 2017-04-11 | Discharge: 2017-04-11 | Disposition: A | Discharge status: Home / Self Care | Payer: Medicare Other | Attending: Emergency Medicine | Admitting: Emergency Medicine

## 2017-04-11 ENCOUNTER — Encounter (HOSPITAL_COMMUNITY): Payer: Self-pay | Admitting: *Deleted

## 2017-04-11 ENCOUNTER — Emergency Department (HOSPITAL_COMMUNITY): Payer: Medicare Other

## 2017-04-11 ENCOUNTER — Other Ambulatory Visit: Payer: Self-pay

## 2017-04-11 DIAGNOSIS — J45909 Unspecified asthma, uncomplicated: Secondary | ICD-10-CM | POA: Diagnosis not present

## 2017-04-11 DIAGNOSIS — Z79899 Other long term (current) drug therapy: Secondary | ICD-10-CM | POA: Insufficient documentation

## 2017-04-11 DIAGNOSIS — R079 Chest pain, unspecified: Secondary | ICD-10-CM | POA: Diagnosis not present

## 2017-04-11 DIAGNOSIS — M79602 Pain in left arm: Secondary | ICD-10-CM | POA: Diagnosis not present

## 2017-04-11 DIAGNOSIS — R0789 Other chest pain: Secondary | ICD-10-CM | POA: Insufficient documentation

## 2017-04-11 DIAGNOSIS — Z7982 Long term (current) use of aspirin: Secondary | ICD-10-CM | POA: Insufficient documentation

## 2017-04-11 DIAGNOSIS — Z96641 Presence of right artificial hip joint: Secondary | ICD-10-CM | POA: Insufficient documentation

## 2017-04-11 LAB — I-STAT TROPONIN, ED
TROPONIN I, POC: 0 ng/mL (ref 0.00–0.08)
Troponin i, poc: 0 ng/mL (ref 0.00–0.08)

## 2017-04-11 LAB — CBC
HCT: 29.9 % — ABNORMAL LOW (ref 39.0–52.0)
HEMOGLOBIN: 9.8 g/dL — AB (ref 13.0–17.0)
MCH: 26.9 pg (ref 26.0–34.0)
MCHC: 32.8 g/dL (ref 30.0–36.0)
MCV: 82.1 fL (ref 78.0–100.0)
Platelets: 353 10*3/uL (ref 150–400)
RBC: 3.64 MIL/uL — ABNORMAL LOW (ref 4.22–5.81)
RDW: 13.3 % (ref 11.5–15.5)
WBC: 5.7 10*3/uL (ref 4.0–10.5)

## 2017-04-11 LAB — BASIC METABOLIC PANEL
ANION GAP: 6 (ref 5–15)
BUN: 8 mg/dL (ref 6–20)
CHLORIDE: 106 mmol/L (ref 101–111)
CO2: 25 mmol/L (ref 22–32)
CREATININE: 1.15 mg/dL (ref 0.61–1.24)
Calcium: 8.8 mg/dL — ABNORMAL LOW (ref 8.9–10.3)
GFR calc non Af Amer: >60 mL/min (ref >60)
Glucose, Bld: 102 mg/dL — ABNORMAL HIGH (ref 65–99)
Potassium: 3.9 mmol/L (ref 3.5–5.1)
Sodium: 137 mmol/L (ref 135–145)

## 2017-04-11 MED ORDER — IOPAMIDOL (ISOVUE-370) INJECTION 76%
INTRAVENOUS | Status: AC
  Administered 2017-04-11: 100 mL
  Filled 2017-04-11: qty 100

## 2017-04-11 NOTE — ED Triage Notes (Signed)
Pt in c/o L arm pain with aching onset last night & L sided CP, pt denies SOB, n/v/d, recent hip surgery, A&O x4

## 2017-04-11 NOTE — ED Notes (Signed)
Patient transported to CT 

## 2017-04-11 NOTE — ED Notes (Signed)
Pt verbalized understanding discharge instructions and denies any further needs or questions at this time. VS stable, ambulatory and steady gait.   

## 2017-04-11 NOTE — ED Provider Notes (Signed)
[facility] EMERGENCY DEPARTMENT Provider Note  CSN: [id number] Arrival date & time: 04/11/17 7829  Chief Complaint(s) Arm Pain  HPI [name] is a 56 y.o. male   The history is provided by the patient.  Arm Pain  This is a new problem. The current episode started 3 to 5 hours ago. The problem occurs constantly. The problem has not changed since onset.Associated symptoms include chest pain (left chest). Pertinent negatives include no abdominal pain, no headaches and no shortness of breath. Nothing aggravates the symptoms. Nothing relieves the symptoms.  Chest Pain   This is a new problem. The current episode started 3 to 5 hours ago. The problem occurs constantly. The problem has been gradually improving. The pain is associated with rest. The pain is present in the lateral region. The pain is moderate. The quality of the pain is described as dull. The pain radiates to the left arm. Associated symptoms include lower extremity edema. Pertinent negatives include no abdominal pain, no cough, no diaphoresis, no headaches, no malaise/fatigue, no nausea, no shortness of breath and no vomiting. Risk factors include alcohol intake.  His past medical history is significant for hypertension.  Pertinent negatives for past medical history include no CAD, no diabetes, no hyperlipidemia, no MI, no PE and no strokes.   Recent right hip replacement.   Past Medical History Past Medical History:  Diagnosis Date  . Asthma    Patient Active Problem List   Diagnosis Date Noted  . Anemia associated with acute blood loss 03/22/2017    Class: Acute  . Status post total replacement of right hip 03/20/2017  . Traumatic arthritis of right hip 02/25/2017  . Pain in right hip 02/25/2017   Home Medication(s) Prior to Admission medications   Medication Sig Start Date End Date Taking? Authorizing Provider  aspirin 81 MG chewable tablet Chew 1 tablet (81 mg total) by mouth 2 (two)  times daily. 03/22/17   Kerrin Champagne, MD  docusate sodium (COLACE) 100 MG capsule Take 1 capsule (100 mg total) by mouth 2 (two) times daily. 03/22/17   Kerrin Champagne, MD  gabapentin (NEURONTIN) 100 MG capsule Take 1 capsule (100 mg total) by mouth 3 (three) times daily. 03/22/17   Kerrin Champagne, MD  methocarbamol (ROBAXIN) 500 MG tablet Take 1 tablet (500 mg total) by mouth every 6 (six) hours as needed for muscle spasms. 03/22/17   Kerrin Champagne, MD  naproxen sodium (ANAPROX) 220 MG tablet Take 440 mg by mouth daily as needed (pain).    [provider]  oxyCODONE 10 MG TABS Take 1-1.5 tablets (10-15 mg total) by mouth every 4 (four) hours as needed for severe pain ((score 7 to 10)). 03/22/17   Kerrin Champagne, MD  tizanidine (ZANAFLEX) 2 MG capsule Take 1 capsule (2 mg total) by mouth 3 (three) times daily as needed for muscle spasms. 03/24/17   Kerrin Champagne, MD  Past Surgical History Past Surgical History:  Procedure Laterality Date  . HIP SURGERY    . RIGHT TOTAL HIP ARTHROPLASTY ANTERIOR APPROACH Right 03/20/2017   Performed by Kathryne HitchBlackman, Christopher Y, MD at [facility]   Family History No family history on file.  Social History Social History   Tobacco Use  . Smoking status: Never Smoker  . Smokeless tobacco: Never Used  Substance Use Topics  . Alcohol use: Yes    Alcohol/week: 1.8 oz    Types: 3 Shots of liquor per week    Comment: social  . Drug use: No   Allergies Aspirin  Review of Systems Review of Systems  Constitutional: Negative for diaphoresis and malaise/fatigue.  Respiratory: Negative for cough and shortness of breath.   Cardiovascular: Positive for chest pain (left chest).  Gastrointestinal: Negative for abdominal pain, nausea and vomiting.  Neurological: Negative for headaches.   All other systems are reviewed and are  negative for acute change except as noted in the HPI  Physical Exam Vital Signs  I have reviewed the triage vital signs BP (!) 141/78 (BP Location: Left Arm)   Pulse 75   Temp 98.4 F (36.9 C) (Oral)   Resp 18   Ht 5\' 7"  (1.702 m)   Wt 115.7 kg (255 lb)   SpO2 97%   BMI 39.94 kg/m   Physical Exam  Constitutional: He is oriented to person, place, and time. He appears well-developed and well-nourished. No distress.  HENT:  Head: Normocephalic and atraumatic.  Nose: Nose normal.  Eyes: Conjunctivae and EOM are normal. Pupils are equal, round, and reactive to light. Right eye exhibits no discharge. Left eye exhibits no discharge. No scleral icterus.  Neck: Normal range of motion. Neck supple.  Cardiovascular: Normal rate and regular rhythm. Exam reveals no gallop and no friction rub.  No murmur heard. Pulmonary/Chest: Effort normal and breath sounds normal. No stridor. No respiratory distress. He has no rales. He exhibits tenderness.  Abdominal: Soft. He exhibits no distension. There is no tenderness.  Musculoskeletal: He exhibits no edema or tenderness.       Legs: BLE edema; L>R  Neurological: He is alert and oriented to person, place, and time.  Skin: Skin is warm and dry. No rash noted. He is not diaphoretic. No erythema.  Psychiatric: He has a normal mood and affect.  Vitals reviewed.   ED Results and Treatments Labs (all labs ordered are listed, but only abnormal results are displayed) Labs Reviewed  BASIC METABOLIC PANEL - Abnormal; Notable for the following components:      Result Value   Glucose, Bld 102 (*)    Calcium 8.8 (*)    All other components within normal limits  CBC - Abnormal; Notable for the following components:   RBC 3.64 (*)    Hemoglobin 9.8 (*)    HCT 29.9 (*)    All other components within normal limits  I-STAT TROPONIN, ED  I-STAT TROPONIN, ED  EKG  EKG Interpretation  Date/Time:  Friday April 11 2017 07:40:04 EST Ventricular Rate:  81 PR Interval:  164 QRS Duration: 84 QT Interval:  366 QTC Calculation: 425 R Axis:   25 Text Interpretation:  Sinus rhythm with Premature atrial complexes with Abberant conduction Otherwise normal ECG No significant change since last tracing Confirmed by Drema Pryardama, Younique Casad [phone]([id number]) on 04/11/2017 8:03:51 AM      Radiology Dg Chest 2 View  Result Date: 04/11/2017 CLINICAL DATA:  Left arm pain and left-sided chest pain beginning yesterday. EXAM: CHEST  2 VIEW COMPARISON:  08/09/2014 FINDINGS: Chronically enlarged cardiac silhouette. Poor inspiration. Question minimal basilar atelectasis. No consolidation or lobar collapse. No effusion. No acute bone finding. IMPRESSION: Poor inspiration.  Cardiomegaly.  Mild basilar atelectasis possible. Electronically Signed   By: Paulina FusiMark  Shogry M.D.   On: 04/11/2017 08:11   Ct Angio Chest Pe W And/or Wo Contrast  Result Date: 04/11/2017 CLINICAL DATA:  Left-sided chest pain. Evaluate for pulmonary embolism. Recent total right hip arthroplasty 2 weeks ago. EXAM: CT ANGIOGRAPHY CHEST WITH CONTRAST TECHNIQUE: Multidetector CT imaging of the chest was performed using the standard protocol during bolus administration of intravenous contrast. Multiplanar CT image reconstructions and MIPs were obtained to evaluate the vascular anatomy. CONTRAST:  100mL ISOVUE-370 IOPAMIDOL (ISOVUE-370) INJECTION 76% COMPARISON:  Chest x-ray from same day. FINDINGS: Cardiovascular: Satisfactory opacification of the pulmonary arteries to the segmental level. No evidence of pulmonary embolism. Mild cardiomegaly. No pericardial effusion. Normal caliber thoracic aorta. Mediastinum/Nodes: No enlarged mediastinal, hilar, or axillary lymph nodes. Thyroid gland, trachea, and esophagus demonstrate no significant findings. Lungs/Pleura: Minimal bibasilar atelectasis. Lungs are otherwise  clear. No pleural effusion or pneumothorax. Upper Abdomen: No acute abnormality. Musculoskeletal: No chest wall abnormality. No acute or significant osseous findings. Degenerative changes of the thoracolumbar spine with evidence of DISH. Review of the MIP images confirms the above findings. IMPRESSION: 1. No evidence of pulmonary embolism. No acute intrathoracic process. Electronically Signed   By: Obie DredgeWilliam T Derry M.D.   On: 04/11/2017 10:20   Pertinent labs & imaging results that were available during my care of the patient were reviewed by me and considered in my medical decision making (see chart for details).  Medications Ordered in ED Medications  iopamidol (ISOVUE-370) 76 % injection (100 mLs  Contrast Given 04/11/17 0946)                                                                                                                                    Procedures Procedures  (including critical care time)  Medical Decision Making / ED Course I have reviewed the nursing notes for this encounter and the patient's prior records (if available in EHR or on provided paperwork).  Clinical Course as of Apr 11 1149  Fri Apr 11, 2017  0830 Atypical chest pain.  EKG without acute ischemic changes or evidence of pericarditis.  Initial troponin negative. HEART <4.  Feel he is appropriate for delta troponin if the rest of the workup is unremarkable.  Given the patient's recent surgery with left greater than right leg swelling, I have concern for pulmonary embolism so a CT scan was obtained.  Presentation not classic for aortic dissection or esophageal perforation.  Chest x-ray without evidence suggestive of pneumonia, pneumothorax, pneumomediastinum.  No abnormal contour of the mediastinum to suggest dissection. No evidence of acute injuries.   [PC]  1035 CTA negative for pulmonary embolism.  We will obtain a delta troponin  [PC]  1127 Delta troponin negative.  The patient appears reasonably  screened and/or stabilized for discharge and I doubt any other medical condition or other [facility] requiring further screening, evaluation, or treatment in the ED at this time prior to discharge.  The patient is safe for discharge with strict return precautions.   [PC]    Clinical Course User Index [PC] Hjalmer Iovino, Amadeo Garnet, MD      Final Clinical Impression(s) / ED Diagnoses Final diagnoses:  Atypical chest pain  Left arm pain     Disposition: Discharge  Condition: Good  I have discussed the results, Dx and Tx plan with the patient who expressed understanding and agree(s) with the plan. Discharge instructions discussed at great length. The patient was given strict return precautions who verbalized understanding of the instructions. No further questions at time of discharge.    ED Discharge Orders    None       Follow Up: Billee Cashing, MD [address] [place] [address] [phone]  Schedule an appointment as soon as possible for a visit  Within 30 days for cardiac stress testing    This chart was dictated using voice recognition software.  Despite best efforts to proofread,  errors can occur which can change the documentation meaning.   Nira Conn, MD 04/11/17 1150

## 2017-04-11 NOTE — Discharge Instructions (Signed)

## 2017-04-14 ENCOUNTER — Ambulatory Visit (INDEPENDENT_AMBULATORY_CARE_PROVIDER_SITE_OTHER): Payer: Medicare Other | Admitting: Physician Assistant

## 2017-04-14 ENCOUNTER — Encounter (INDEPENDENT_AMBULATORY_CARE_PROVIDER_SITE_OTHER): Payer: Self-pay | Admitting: Physician Assistant

## 2017-04-14 DIAGNOSIS — Z96641 Presence of right artificial hip joint: Secondary | ICD-10-CM

## 2017-04-14 NOTE — Progress Notes (Signed)
[name] returns today now 25 days status post right total hip arthroplasty.  States he is having swelling over the right lateral aspect of the hip again once fluid,.  He also had some left arm pain which is resolved.  He had full cardiac workup it was negative for any type of cardiac event as the source of his left arm pain.  States now that he is on a mild muscle relaxer is no longer having the arm pain.  He denies any radicular symptoms currently down the arm.  He has had no neck pain .  Physical exam: Right hip surgical incision is healing well.  Large seroma.  There is no erythema no ecchymosis abnormal warmth.  He has overall good range of motion of the hip.  Is able to get on and off exam table on his own.  Procedure: Right hip was prepped with Betadine and ethyl chloride used to anesthetize skin and then 110 cc of serosanguineous fluid is aspirated patient tolerates well.  Plan: We will see him back at his regular scheduled appointment approximately December 12.  Sooner if there is any questions or concerns or recurrent seroma.  He did ask about getting additional pain medication.  I discussed with him and that he just gotten oxycodone 02/27/2024 from Dr. Thea SilversmithMackenzie on 1115 #90 is not time for refill.  Also discussed with him that I would like for him to get his pain medicine from just one provider and therefore will have Dr. Ronne BinningMcKenzie prescribe his pain medications.

## 2017-05-07 ENCOUNTER — Encounter (INDEPENDENT_AMBULATORY_CARE_PROVIDER_SITE_OTHER): Payer: Self-pay | Admitting: Orthopaedic Surgery

## 2017-05-07 ENCOUNTER — Ambulatory Visit (INDEPENDENT_AMBULATORY_CARE_PROVIDER_SITE_OTHER): Payer: Medicare Other | Admitting: Orthopaedic Surgery

## 2017-05-07 DIAGNOSIS — Z96641 Presence of right artificial hip joint: Secondary | ICD-10-CM

## 2017-05-07 NOTE — Progress Notes (Signed)
[name] is now 6 weeks status post a right total hip arthroplasty through direct anterior approach.  He says he is doing great and has no issues at all.  On examination does have still some swelling near his incision but this is more of a seroma that is  lessening.  There is nothing to drain today.  I gave him reassurance that this looks normal.  There is no evidence of redness or infection.  His leg lengths are equal.  He is walking without assistive device.  The left knee easily put his left hip and right hip the range of motion with no difficulty on either side.  At this point I do not need to see him back for 6 months unless he is having issues.  I would like a low AP pelvis and lateral of his right operative hip at that visit.

## 2017-05-12 DIAGNOSIS — Z79891 Long term (current) use of opiate analgesic: Secondary | ICD-10-CM | POA: Diagnosis not present

## 2017-07-21 ENCOUNTER — Ambulatory Visit: Payer: Medicare Other

## 2017-07-21 ENCOUNTER — Encounter: Payer: Self-pay | Admitting: Podiatry

## 2017-07-21 ENCOUNTER — Ambulatory Visit (INDEPENDENT_AMBULATORY_CARE_PROVIDER_SITE_OTHER): Payer: Medicare Other | Admitting: Podiatry

## 2017-07-21 VITALS — BP 141/75 | HR 76 | Resp 16

## 2017-07-21 DIAGNOSIS — M778 Other enthesopathies, not elsewhere classified: Secondary | ICD-10-CM

## 2017-07-21 DIAGNOSIS — B353 Tinea pedis: Secondary | ICD-10-CM

## 2017-07-21 DIAGNOSIS — M779 Enthesopathy, unspecified: Principal | ICD-10-CM

## 2017-07-21 MED ORDER — CLOTRIMAZOLE-BETAMETHASONE 1-0.05 % EX CREA: 1.0000 "application " | TOPICAL_CREAM | Freq: Two times a day (BID) | CUTANEOUS | 2 refills | Status: DC

## 2017-07-21 MED ORDER — TERBINAFINE HCL 250 MG PO TABS: 250.0000 mg | ORAL_TABLET | Freq: Every day | ORAL | 0 refills | Status: DC

## 2017-07-21 NOTE — Progress Notes (Signed)
   Subjective:    Patient ID: [name], male    DOB: 26-May-1961, 57 y.o.   MRN: [id number]  HPI Chief Complaint  Patient presents with  . Skin Problem    Plantar and lateral foot bilateral (L>R) - itching, areas of dry patches x several years intermittently, open areas from scratching, discoloration, tried multiple OTC meds-no help      Review of Systems  Skin: Positive for color change, rash and wound.  All other systems reviewed and are negative.      Objective:   Physical Exam        Assessment & Plan:

## 2017-07-23 NOTE — Progress Notes (Signed)
   HPI: 57 year old male presenting today as a new patient with a chief complaint of itching to the plantar and lateral aspects of bilateral feet, left worse than right, that has been ongoing intermittently for several years. He reports associated patches of dry skin with some areas open and discolored from scratching. He states he has tried multiple OTC medications with no significant relief. There are no modifying factors noted. Patient is here for further evaluation and treatment.   Past Medical History:  Diagnosis Date  . Asthma      Physical Exam: General: The patient is alert and oriented x3 in no acute distress.  Dermatology: Pruritus to bilateral feet with hyperkeratosis. Skin is warm, dry and supple bilateral lower extremities. Negative for open lesions or macerations.  Vascular: Palpable pedal pulses bilaterally. No edema or erythema noted. Capillary refill within normal limits.  Neurological: Epicritic and protective threshold grossly intact bilaterally.   Musculoskeletal Exam: Range of motion within normal limits to all pedal and ankle joints bilateral. Muscle strength 5/5 in all groups bilateral.    Assessment: - tinea pedis bilateral   Plan of Care:  - Patient evaluated.  - Prescription for Lotrisone cream provided to patient.  - Prescription for Lamisil 250 mg #28 provided to patient.  - Recommended good foot hygiene with good shoe gear.  - Return to clinic in 4 weeks.    Felecia ShellingBrent M. Nikkolas Coomes, DPM [facility]  Dr. Felecia ShellingBrent M. Lupie Sawa, DPM    [address]                                        [place], [place] [address]                Office [phone]  Fax [phone]

## 2017-08-18 ENCOUNTER — Ambulatory Visit: Payer: Medicare Other | Admitting: Podiatry

## 2017-08-25 ENCOUNTER — Ambulatory Visit (INDEPENDENT_AMBULATORY_CARE_PROVIDER_SITE_OTHER): Payer: Medicare Other | Admitting: Podiatry

## 2017-08-25 ENCOUNTER — Encounter: Payer: Self-pay | Admitting: Podiatry

## 2017-08-25 DIAGNOSIS — B353 Tinea pedis: Secondary | ICD-10-CM

## 2017-08-25 MED ORDER — TERBINAFINE HCL 250 MG PO TABS: 250.0000 mg | ORAL_TABLET | Freq: Every day | ORAL | 0 refills | Status: DC

## 2017-08-25 MED ORDER — CLOTRIMAZOLE-BETAMETHASONE 1-0.05 % EX CREA: 1.0000 "application " | TOPICAL_CREAM | Freq: Two times a day (BID) | CUTANEOUS | 2 refills | Status: AC

## 2017-08-26 DIAGNOSIS — Z0000 Encounter for general adult medical examination without abnormal findings: Secondary | ICD-10-CM | POA: Diagnosis not present

## 2017-08-26 DIAGNOSIS — Z131 Encounter for screening for diabetes mellitus: Secondary | ICD-10-CM | POA: Diagnosis not present

## 2017-08-26 DIAGNOSIS — Z113 Encounter for screening for infections with a predominantly sexual mode of transmission: Secondary | ICD-10-CM | POA: Diagnosis not present

## 2017-08-26 DIAGNOSIS — E669 Obesity, unspecified: Secondary | ICD-10-CM | POA: Diagnosis not present

## 2017-08-26 DIAGNOSIS — G894 Chronic pain syndrome: Secondary | ICD-10-CM | POA: Diagnosis not present

## 2017-08-26 DIAGNOSIS — J45909 Unspecified asthma, uncomplicated: Secondary | ICD-10-CM | POA: Diagnosis not present

## 2017-08-26 DIAGNOSIS — Z136 Encounter for screening for cardiovascular disorders: Secondary | ICD-10-CM | POA: Diagnosis not present

## 2017-08-26 DIAGNOSIS — H538 Other visual disturbances: Secondary | ICD-10-CM | POA: Diagnosis not present

## 2017-08-26 DIAGNOSIS — Z01118 Encounter for examination of ears and hearing with other abnormal findings: Secondary | ICD-10-CM | POA: Diagnosis not present

## 2017-08-26 DIAGNOSIS — Z5181 Encounter for therapeutic drug level monitoring: Secondary | ICD-10-CM | POA: Diagnosis not present

## 2017-08-26 DIAGNOSIS — I10 Essential (primary) hypertension: Secondary | ICD-10-CM | POA: Diagnosis not present

## 2017-08-27 NOTE — Progress Notes (Signed)
   HPI: 57 year old male presenting today for follow up evaluation of tinea pedis of bilateral feet. He states the skin looks improved. He is requesting a refill on the Lotrisone cream and is still currently taking the Lamisil. Patient is here for further evaluation and treatment.   Past Medical History:  Diagnosis Date  . Asthma      Physical Exam: General: The patient is alert and oriented x3 in no acute distress.  Dermatology: Pruritus to bilateral feet with hyperkeratosis. Skin is warm, dry and supple bilateral lower extremities. Negative for open lesions or macerations.  Vascular: Palpable pedal pulses bilaterally. No edema or erythema noted. Capillary refill within normal limits.  Neurological: Epicritic and protective threshold grossly intact bilaterally.   Musculoskeletal Exam: Range of motion within normal limits to all pedal and ankle joints bilateral. Muscle strength 5/5 in all groups bilateral.    Assessment: - tinea pedis bilateral - improved   Plan of Care:  - Patient evaluated.  - Refill prescription for Lotrisone cream provided to patient.  - Refill prescription for Lamisil 250 mg #28 provided to patient.  - Recommended good foot hygiene with good shoe gear.  - Return to clinic as needed.    Felecia ShellingBrent M. Evans, DPM [facility]  Dr. Felecia ShellingBrent M. Evans, DPM    [address]                                        [place], [place] [address]                Office [phone]  Fax [phone]

## 2017-09-17 DIAGNOSIS — H353132 Nonexudative age-related macular degeneration, bilateral, intermediate dry stage: Secondary | ICD-10-CM | POA: Diagnosis not present

## 2017-09-17 DIAGNOSIS — H40003 Preglaucoma, unspecified, bilateral: Secondary | ICD-10-CM | POA: Diagnosis not present

## 2017-11-05 ENCOUNTER — Ambulatory Visit (INDEPENDENT_AMBULATORY_CARE_PROVIDER_SITE_OTHER): Payer: Medicare Other

## 2017-11-05 ENCOUNTER — Ambulatory Visit (INDEPENDENT_AMBULATORY_CARE_PROVIDER_SITE_OTHER): Payer: Medicare Other | Admitting: Orthopaedic Surgery

## 2017-11-05 ENCOUNTER — Ambulatory Visit (INDEPENDENT_AMBULATORY_CARE_PROVIDER_SITE_OTHER): Payer: Medicare Other | Admitting: Physician Assistant

## 2017-11-05 ENCOUNTER — Encounter (INDEPENDENT_AMBULATORY_CARE_PROVIDER_SITE_OTHER): Payer: Self-pay | Admitting: Physician Assistant

## 2017-11-05 DIAGNOSIS — Z96641 Presence of right artificial hip joint: Secondary | ICD-10-CM

## 2017-11-05 NOTE — Progress Notes (Signed)
HPI: [name] returns today 8 months status post right total hip arthroplasty.  He states he is doing really well.  States is very happy with the results should have done the hip replacement she sooner.  He is having no pain in his right hip.  Physical exam: Right hip: Good range of motion without pain.  Ambulates without any assistive device.  Has a nonantalgic gait.  Right calf supple nontender.    Radiographs: AP pelvis lateral view of the right hip shows no acute fractures.  Right hip is well located.  Total hip arthroplasty components are well-seated.  No signs of hardware failure.  Impression: 8 months status post right total hip arthroplasty  Plan: He will follow-up with us on an as-needed basis he has any questions or concerns.

## 2018-01-06 ENCOUNTER — Encounter (HOSPITAL_COMMUNITY): Payer: Self-pay

## 2018-01-06 ENCOUNTER — Ambulatory Visit (HOSPITAL_COMMUNITY)
Admission: EM | Admit: 2018-01-06 | Discharge: 2018-01-06 | Disposition: A | Discharge status: Home / Self Care | Payer: Medicare Other | Attending: Family Medicine | Admitting: Family Medicine

## 2018-01-06 DIAGNOSIS — R3911 Hesitancy of micturition: Secondary | ICD-10-CM | POA: Diagnosis not present

## 2018-01-06 LAB — POCT URINALYSIS DIP (DEVICE)
Bilirubin Urine: NEGATIVE
GLUCOSE, UA: NEGATIVE mg/dL
HGB URINE DIPSTICK: NEGATIVE
Ketones, ur: NEGATIVE mg/dL
Leukocytes, UA: NEGATIVE
NITRITE: NEGATIVE
PROTEIN: NEGATIVE mg/dL
SPECIFIC GRAVITY, URINE: 1.02 (ref 1.005–1.030)
UROBILINOGEN UA: 0.2 mg/dL (ref 0.0–1.0)
pH: 5.5 (ref 5.0–8.0)

## 2018-01-06 NOTE — ED Triage Notes (Signed)
Pt presents with symptoms of pain with urination

## 2018-01-06 NOTE — ED Provider Notes (Signed)
[facility]    ASSESSMENT & PLAN:  1. Urinary hesitancy    Discussed symptoms of BPH. Recommended urology evaluation for treatment options and to follow.  Follow-up Information    Schedule an appointment as soon as possible for a visit  with [facility].   Contact information: [address] [place] [id number] [phone]         U/A normal.  Outlined signs and symptoms indicating need for more acute intervention. Patient verbalized understanding. After Visit Summary given.  SUBJECTIVE:  Roselyn ReefRichard C Garver is a 57 y.o. male who complains of urinary hesitancy at onset of urination. Gradual onset over the past year; maybe longer. Eventually able to urinate but with weak stream. No hematuria. No dysuria. No abdominal symptoms. Weight stable. Normal appetite. No specific aggravating or alleviating factors reported. Occasionally with sudden urge to urinate but still with hesitancy. Rises once or twice at night to urinate; no increased nighttime urge. No self treatment. Ambulatory without difficulty.   ROS: As in HPI.  OBJECTIVE:  Vitals:   01/06/18 0921  BP: (!) 161/92  Pulse: 73  Resp: 20  Temp: 98.5 F (36.9 C)  TempSrc: Oral  SpO2: 93%   Appears well, in no apparent distress. Abdomen is soft without tenderness, guarding, mass, rebound or organomegaly. No prostate exam today; prefers urology. No CVA tenderness or inguinal adenopathy noted.  Labs Reviewed  POCT URINALYSIS DIP (DEVICE)    Allergies  Allergen Reactions  . Aspirin Nausea Only    Past Medical History:  Diagnosis Date  . Asthma    Social History   Socioeconomic History  . Marital status: Divorced    Spouse name: Not on file  . Number of children: Not on file  . Years of education: Not on file  . Highest education level: Not on file  Occupational History  . Not on file  Social Needs  . Financial resource strain: Not on file  . Food  insecurity:    Worry: Not on file    Inability: Not on file  . Transportation needs:    Medical: Not on file    Non-medical: Not on file  Tobacco Use  . Smoking status: Never Smoker  . Smokeless tobacco: Never Used  Substance and Sexual Activity  . Alcohol use: Yes    Alcohol/week: 3.0 standard drinks    Types: 3 Shots of liquor per week    Comment: social  . Drug use: No  . Sexual activity: Not on file  Lifestyle  . Physical activity:    Days per week: Not on file    Minutes per session: Not on file  . Stress: Not on file  Relationships  . Social connections:    Talks on phone: Not on file    Gets together: Not on file    Attends religious service: Not on file    Active member of club or organization: Not on file    Attends meetings of clubs or organizations: Not on file    Relationship status: Not on file  . Intimate partner violence:    Fear of current or ex partner: Not on file    Emotionally abused: Not on file    Physically abused: Not on file    Forced sexual activity: Not on file  Other Topics Concern  . Not on file  Social History Narrative   ** Merged History Encounter **       History reviewed. No  pertinent family history.     Mardella LaymanHagler, Lahna Nath, MD 01/06/18 1025

## 2018-01-08 DIAGNOSIS — N528 Other male erectile dysfunction: Secondary | ICD-10-CM | POA: Diagnosis not present

## 2018-01-08 DIAGNOSIS — R3912 Poor urinary stream: Secondary | ICD-10-CM | POA: Diagnosis not present

## 2018-01-08 DIAGNOSIS — N401 Enlarged prostate with lower urinary tract symptoms: Secondary | ICD-10-CM | POA: Diagnosis not present

## 2018-01-08 DIAGNOSIS — R300 Dysuria: Secondary | ICD-10-CM | POA: Diagnosis not present

## 2018-03-09 DIAGNOSIS — N401 Enlarged prostate with lower urinary tract symptoms: Secondary | ICD-10-CM | POA: Diagnosis not present

## 2018-03-09 DIAGNOSIS — N528 Other male erectile dysfunction: Secondary | ICD-10-CM | POA: Diagnosis not present

## 2018-03-09 DIAGNOSIS — R3912 Poor urinary stream: Secondary | ICD-10-CM | POA: Diagnosis not present

## 2019-06-24 DIAGNOSIS — Z01818 Encounter for other preprocedural examination: Secondary | ICD-10-CM | POA: Diagnosis not present

## 2019-06-24 DIAGNOSIS — H40003 Preglaucoma, unspecified, bilateral: Secondary | ICD-10-CM | POA: Diagnosis not present

## 2019-06-24 DIAGNOSIS — H25811 Combined forms of age-related cataract, right eye: Secondary | ICD-10-CM | POA: Diagnosis not present

## 2019-06-24 DIAGNOSIS — H353132 Nonexudative age-related macular degeneration, bilateral, intermediate dry stage: Secondary | ICD-10-CM | POA: Diagnosis not present

## 2019-06-24 DIAGNOSIS — H25812 Combined forms of age-related cataract, left eye: Secondary | ICD-10-CM | POA: Diagnosis not present

## 2019-06-24 DIAGNOSIS — H25041 Posterior subcapsular polar age-related cataract, right eye: Secondary | ICD-10-CM | POA: Diagnosis not present

## 2019-07-05 DIAGNOSIS — H25811 Combined forms of age-related cataract, right eye: Secondary | ICD-10-CM | POA: Diagnosis not present

## 2019-07-05 DIAGNOSIS — H2511 Age-related nuclear cataract, right eye: Secondary | ICD-10-CM | POA: Diagnosis not present

## 2019-07-19 DIAGNOSIS — H25812 Combined forms of age-related cataract, left eye: Secondary | ICD-10-CM | POA: Diagnosis not present

## 2019-07-19 DIAGNOSIS — H2512 Age-related nuclear cataract, left eye: Secondary | ICD-10-CM | POA: Diagnosis not present

## 2019-08-22 IMAGING — CT CT ANGIO CHEST
2 of 8 series · 18 of 46 positions shown · IV contrast (OMNI)
Comparison: Chest x-ray from same day.

CLINICAL DATA: Left-sided chest pain. Evaluate for pulmonary
embolism. Recent total right hip arthroplasty 2 weeks ago.

EXAM:
CT ANGIOGRAPHY CHEST WITH CONTRAST
TECHNIQUE: Multidetector CT imaging of the chest was performed using the
standard protocol during bolus administration of intravenous
contrast. Multiplanar CT image reconstructions and MIPs were
obtained to evaluate the vascular anatomy.
CONTRAST:  100mL 2L6PGI-SXO IOPAMIDOL (2L6PGI-SXO) INJECTION 76%

[Series 6: thins · axial · 0.76mm/px · z∈[+1332,+1573]mm · 15 of 267 slices shown]
[im 13/267  lung]
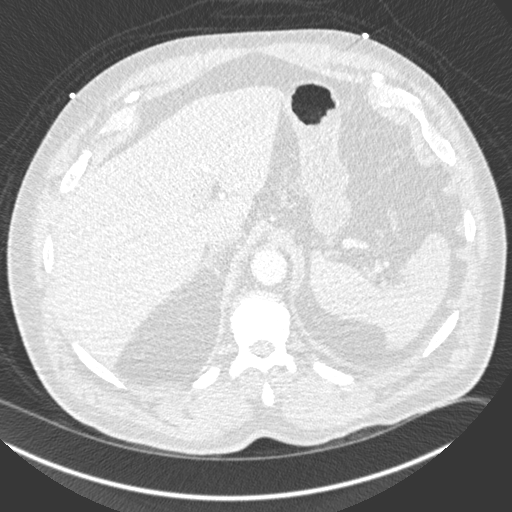
[im 37/267  soft-tissue]
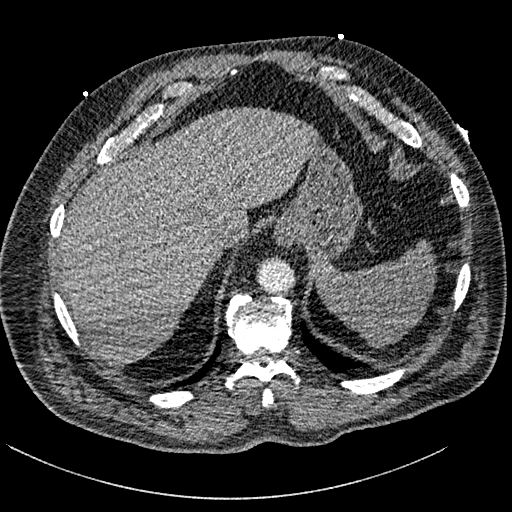
[im 49/267  lung]
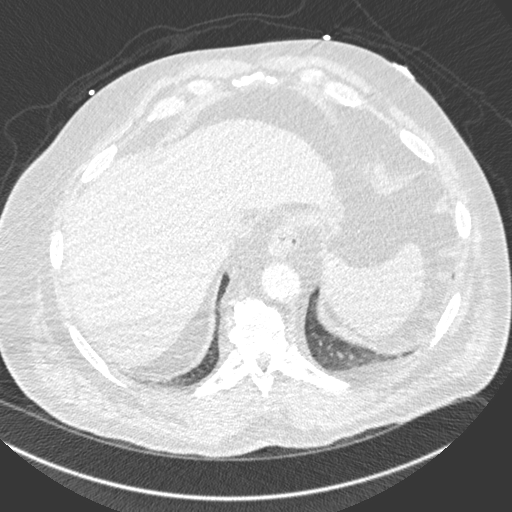
[im 61/267  soft-tissue]
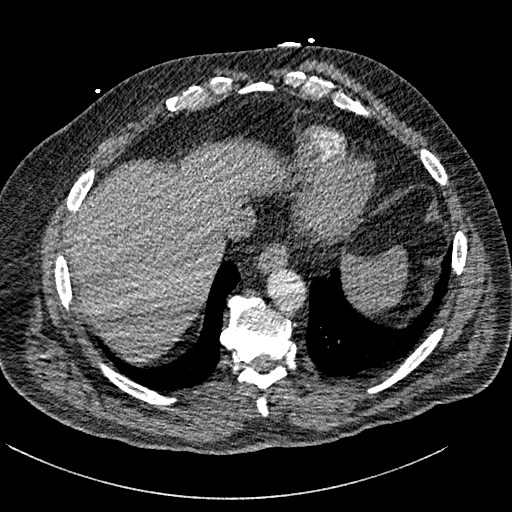
[im 85/267  lung]
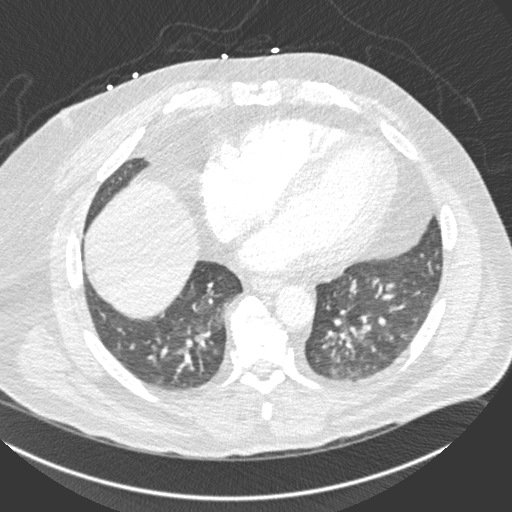
[im 97/267  soft-tissue]
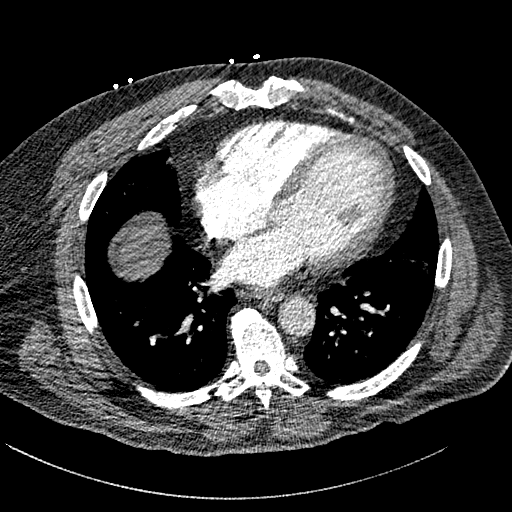
[im 121/267  lung]
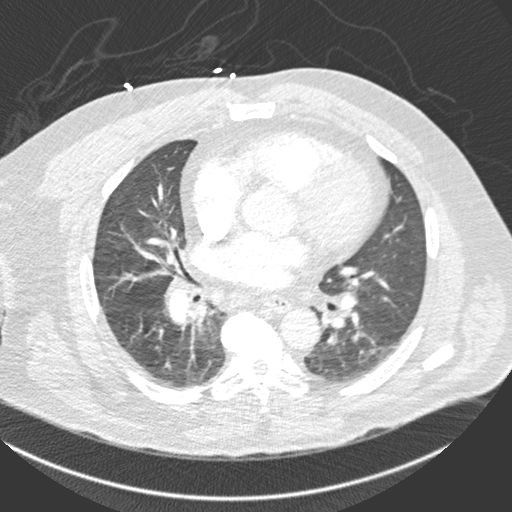
[im 134/267  soft-tissue]
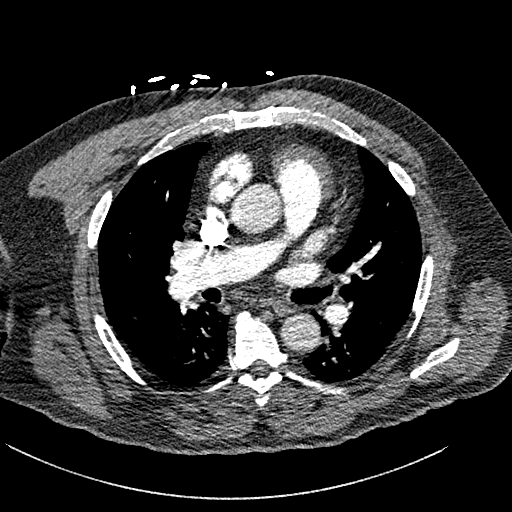
[im 146/267  lung]
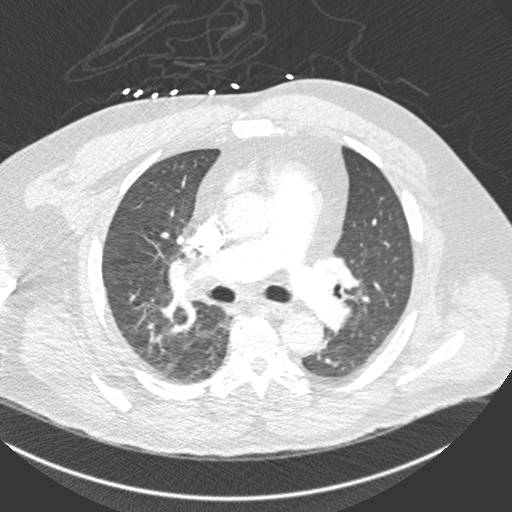
[im 170/267  soft-tissue]
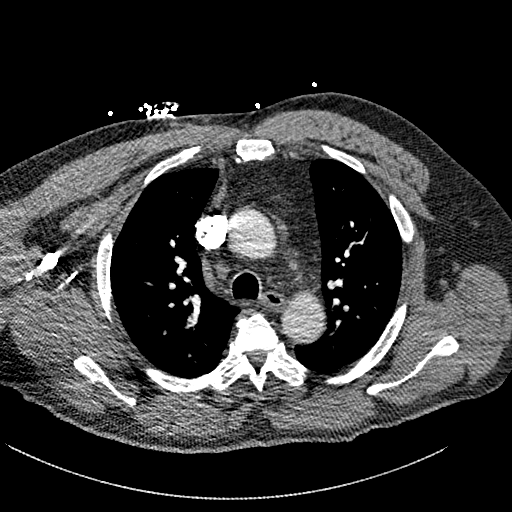
[im 182/267  lung]
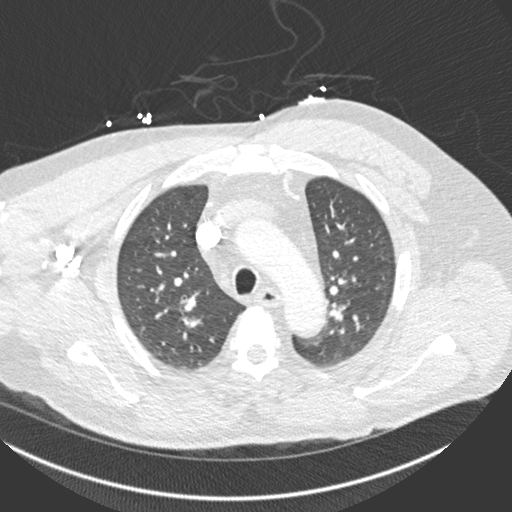
[im 206/267  soft-tissue]
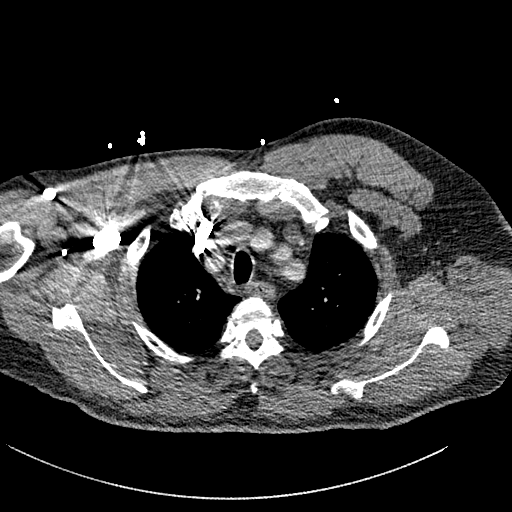
[im 218/267  lung]
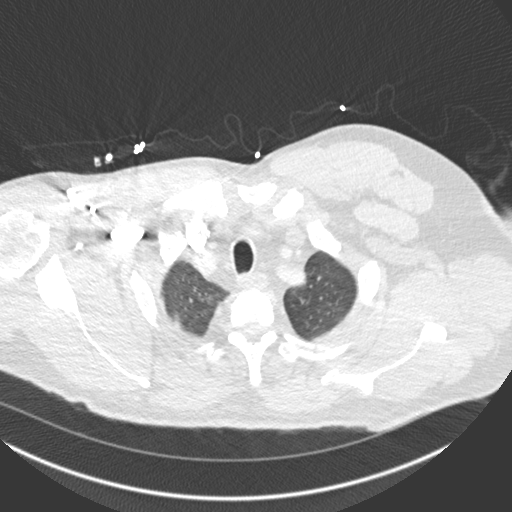
[im 230/267  soft-tissue]
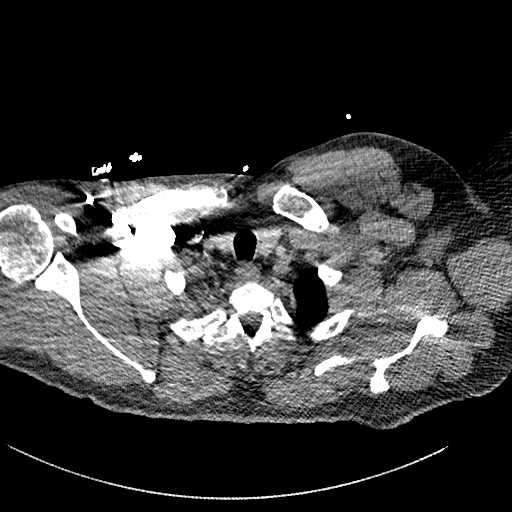
[im 254/267  lung]
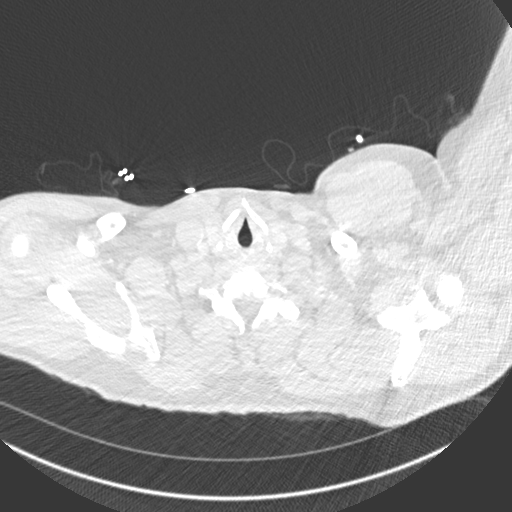

[Series 8: coronal mpr · coronal · 0.59mm/px · 3 of 145 slices shown]
[im 37/145  soft-tissue]
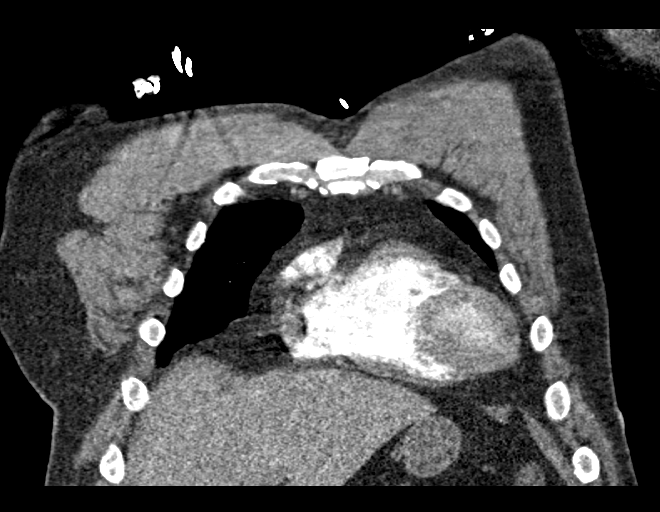
[im 73/145  soft-tissue]
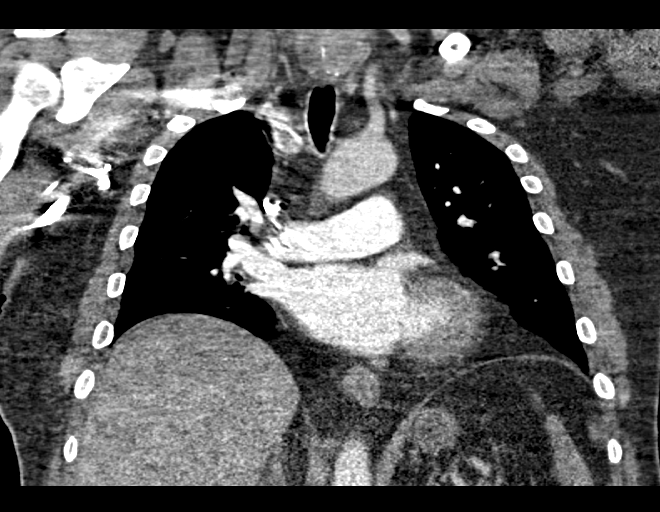
[im 109/145  soft-tissue]
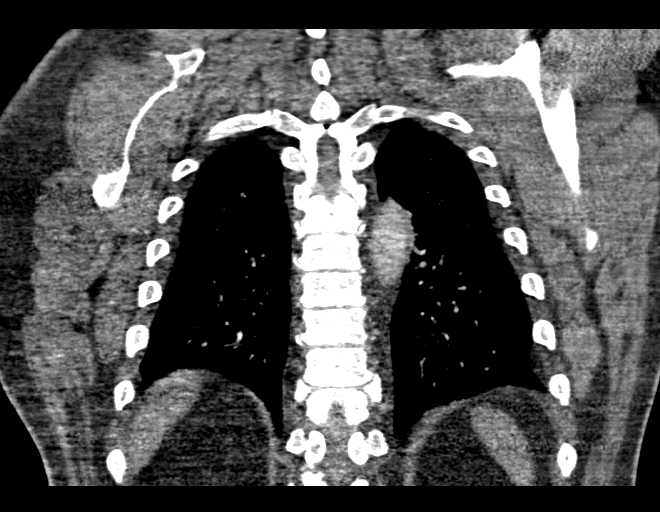

[18 of 46 positions shown; findings below may reference images not displayed]

FINDINGS: Cardiovascular: Satisfactory opacification of the pulmonary arteries
to the segmental level. No evidence of pulmonary embolism. Mild
cardiomegaly. No pericardial effusion. Normal caliber thoracic
aorta.

Mediastinum/Nodes: No enlarged mediastinal, hilar, or axillary lymph
nodes. Thyroid gland, trachea, and esophagus demonstrate no
significant findings.

Lungs/Pleura: Minimal bibasilar atelectasis. Lungs are otherwise
clear. No pleural effusion or pneumothorax.

Upper Abdomen: No acute abnormality.

Musculoskeletal: No chest wall abnormality. No acute or significant
osseous findings. Degenerative changes of the thoracolumbar spine
with evidence of DISH.

Review of the MIP images confirms the above findings.
IMPRESSION: 1. No evidence of pulmonary embolism. No acute intrathoracic
process.

## 2019-08-22 IMAGING — CR DG CHEST 2V
2 series · 2 of 2 positions shown · non-contrast
Comparison: 08/09/2014

CLINICAL DATA: Left arm pain and left-sided chest pain beginning
yesterday.

EXAM:
CHEST  2 VIEW

[chest lat]
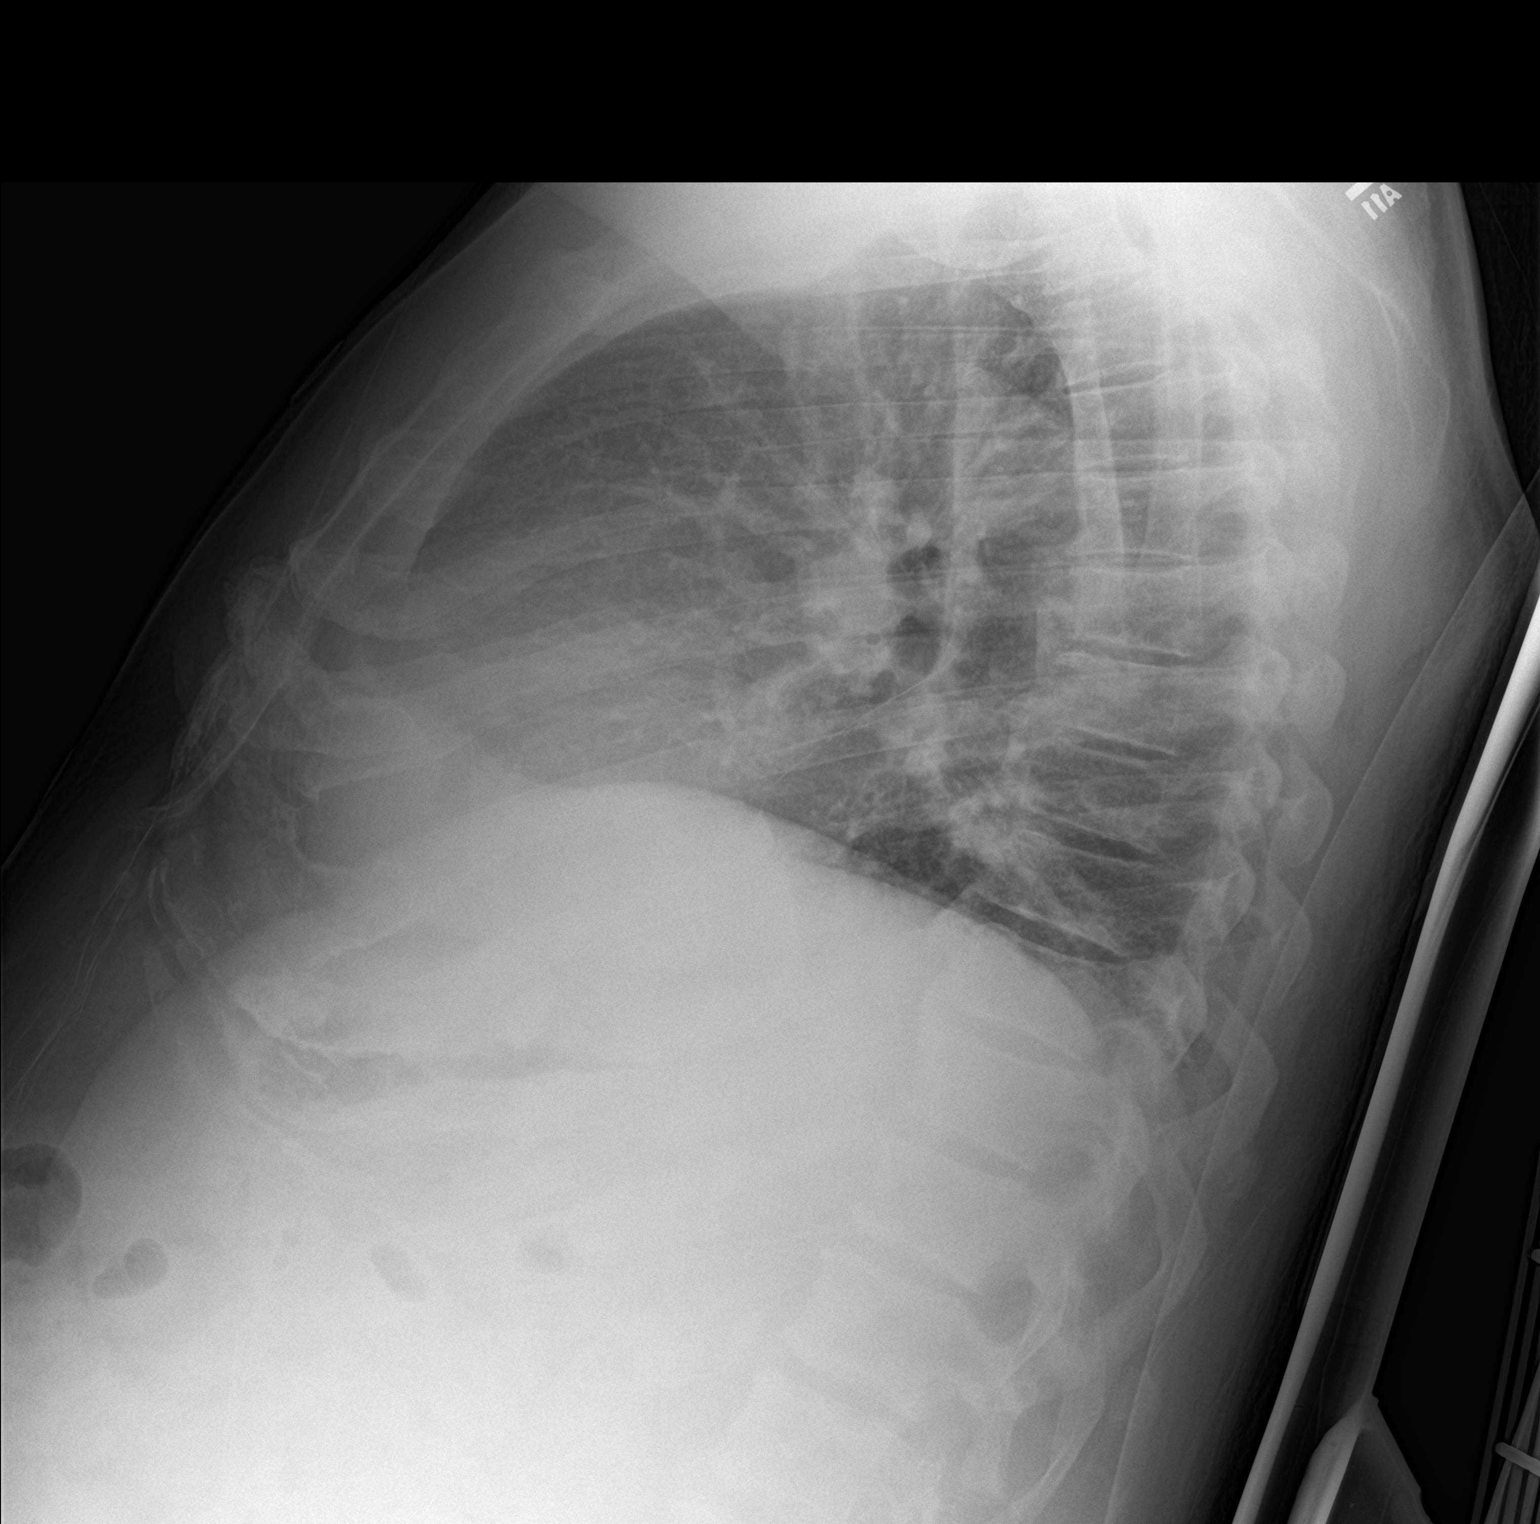

[chest ap]
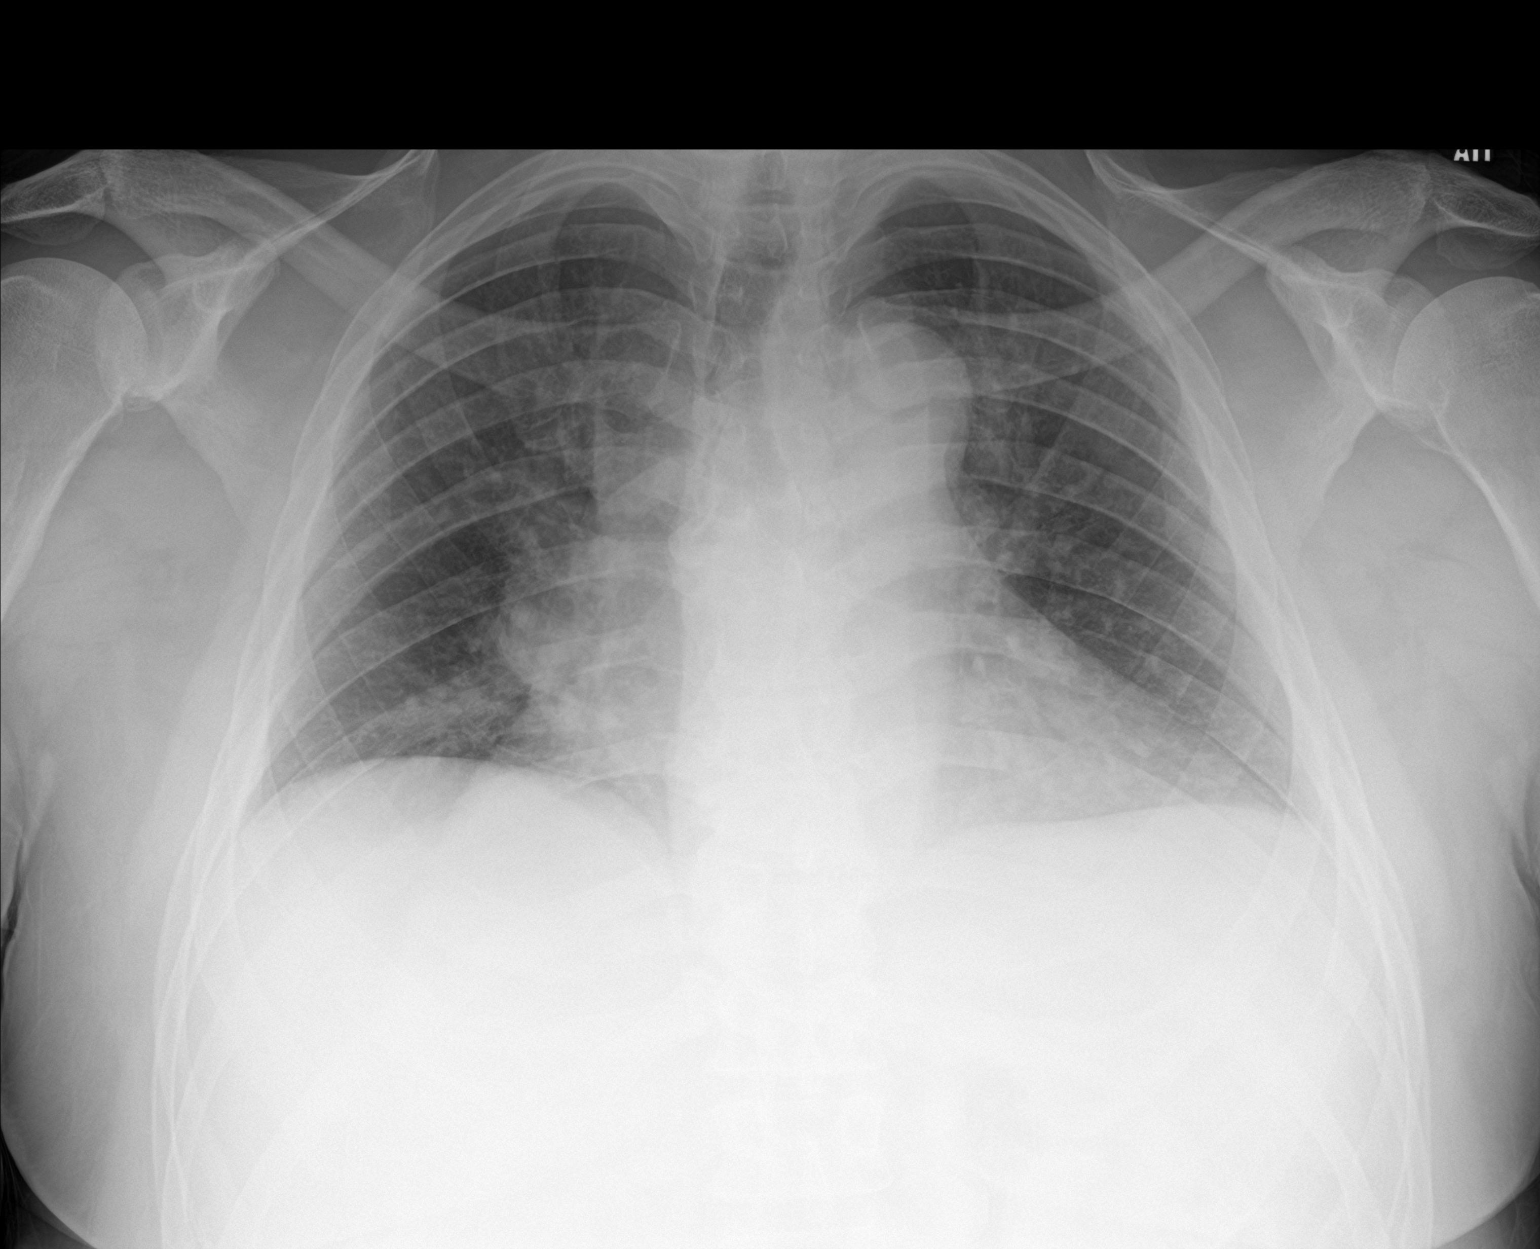

[2 of 2 positions shown; findings below may reference images not displayed]

FINDINGS: Chronically enlarged cardiac silhouette. Poor inspiration. Question
minimal basilar atelectasis. No consolidation or lobar collapse. No
effusion. No acute bone finding.
IMPRESSION: Poor inspiration.  Cardiomegaly.  Mild basilar atelectasis possible.

## 2019-11-22 DIAGNOSIS — R310 Gross hematuria: Secondary | ICD-10-CM | POA: Diagnosis not present

## 2019-12-28 DIAGNOSIS — R310 Gross hematuria: Secondary | ICD-10-CM | POA: Diagnosis not present

## 2019-12-28 DIAGNOSIS — R3912 Poor urinary stream: Secondary | ICD-10-CM | POA: Diagnosis not present

## 2019-12-28 DIAGNOSIS — N401 Enlarged prostate with lower urinary tract symptoms: Secondary | ICD-10-CM | POA: Diagnosis not present

## 2020-02-09 DIAGNOSIS — N400 Enlarged prostate without lower urinary tract symptoms: Secondary | ICD-10-CM | POA: Diagnosis not present

## 2020-02-09 DIAGNOSIS — N3289 Other specified disorders of bladder: Secondary | ICD-10-CM | POA: Diagnosis not present

## 2020-02-09 DIAGNOSIS — R310 Gross hematuria: Secondary | ICD-10-CM | POA: Diagnosis not present

## 2021-11-05 ENCOUNTER — Encounter (HOSPITAL_COMMUNITY): Payer: Self-pay | Admitting: Emergency Medicine

## 2021-11-05 ENCOUNTER — Ambulatory Visit (HOSPITAL_COMMUNITY)
Admission: EM | Admit: 2021-11-05 | Discharge: 2021-11-05 | Disposition: A | Discharge status: Home / Self Care | Payer: Medicare Other | Attending: Emergency Medicine | Admitting: Emergency Medicine

## 2021-11-05 DIAGNOSIS — R220 Localized swelling, mass and lump, head: Secondary | ICD-10-CM | POA: Diagnosis not present

## 2021-11-05 DIAGNOSIS — L237 Allergic contact dermatitis due to plants, except food: Secondary | ICD-10-CM | POA: Diagnosis not present

## 2021-11-05 MED ORDER — METHYLPREDNISOLONE SODIUM SUCC 125 MG IJ SOLR
INTRAMUSCULAR | Status: AC
  Filled 2021-11-05: qty 2

## 2021-11-05 MED ORDER — PREDNISONE 10 MG PO TABS: 40.0000 mg | ORAL_TABLET | Freq: Every day | ORAL | 0 refills | Status: AC

## 2021-11-05 MED ORDER — METHYLPREDNISOLONE SODIUM SUCC 125 MG IJ SOLR
60.0000 mg | Freq: Once | INTRAMUSCULAR | Status: AC
  Administered 2021-11-05: 60 mg via INTRAMUSCULAR

## 2021-11-05 NOTE — Discharge Instructions (Addendum)
Please take the prednisone as prescribed. Take four (4) pills once daily for the next three (3) days.   If your symptoms do not improve please return to the urgent care or go to the emergency department.

## 2021-11-05 NOTE — ED Provider Notes (Signed)
[place]    CSN: [id number] Arrival date & time: 11/05/21  [id number]     History   Chief Complaint Chief Complaint  Patient presents with   Poison Ivy   Facial Swelling    HPI [name] is a 61 y.o. male.  Presents with rash since Friday.  Reports he got into poison ivy.  After touching the plant he then scratched at his face.  Reports itching to bilateral arms, left thigh, back, around the eyes.  He has been trying topical ointment that has helped with itching but rash remains and facial swelling.  He denies any shortness of breath or trouble breathing, swelling of the tongue or lips, abdominal pain, vomiting or diarrhea.  Past Medical History:  Diagnosis Date   Asthma     Patient Active Problem List   Diagnosis Date Noted   Anemia associated with acute blood loss 03/22/2017    Class: Acute   Status post total replacement of right hip 03/20/2017   Traumatic arthritis of right hip 02/25/2017   Pain in right hip 02/25/2017    Past Surgical History:  Procedure Laterality Date   HIP SURGERY     TOTAL HIP ARTHROPLASTY Right 03/20/2017   Procedure: RIGHT TOTAL HIP ARTHROPLASTY ANTERIOR APPROACH;  Surgeon: Mcarthur Rossetti, MD;  Location: [place];  Service: Orthopedics;  Laterality: Right;       Home Medications    Prior to Admission medications   Medication Sig Start Date End Date Taking? Authorizing Provider  predniSONE (DELTASONE) 10 MG tablet Take 4 tablets (40 mg total) by mouth daily with breakfast for 3 days. 11/05/21 11/08/21 Yes Carmel Garfield, Wells Guiles, PA-C  clotrimazole-betamethasone (LOTRISONE) cream Apply 1 application topically 2 (two) times daily. 08/25/17   Edrick Kins, DPM    Family History No family history on file.  Social History Social History   Tobacco Use   Smoking status: Never   Smokeless tobacco: Never  Vaping Use   Vaping Use: Never used  Substance Use Topics   Alcohol use: Yes    Alcohol/week: 3.0 standard drinks of  alcohol    Types: 3 Shots of liquor per week    Comment: social   Drug use: No     Allergies   Aspirin   Review of Systems Review of Systems  Per HPI  Physical Exam Triage Vital Signs ED Triage Vitals  Enc Vitals Group     BP 11/05/21 1052 (!) 154/85     Pulse Rate 11/05/21 1052 100     Resp 11/05/21 1052 18     Temp 11/05/21 1052 98.1 F (36.7 C)     Temp Source 11/05/21 1052 Oral     SpO2 11/05/21 1052 98 %     Weight --      Height --      Head Circumference --      Peak Flow --      Pain Score 11/05/21 1051 5     Pain Loc --      Pain Edu? --      Excl. in [place]? --    No data found.  Updated Vital Signs BP (!) 169/86 (BP Location: Right Arm)   Pulse 100   Temp 98.1 F (36.7 C) (Oral)   Resp 18   SpO2 98%   Visual Acuity Right Eye Distance: 21/50 Left Eye Distance: 20/25 Bilateral Distance:     Physical Exam Vitals and nursing note reviewed.  Constitutional:  General: He is not in acute distress.    Appearance: Normal appearance.  HENT:     Head:     Comments: Periorbital swelling bilaterally.  Vision is intact, no pain    Mouth/Throat:     Mouth: Mucous membranes are moist. No angioedema.     Pharynx: Oropharynx is clear. No pharyngeal swelling or posterior oropharyngeal erythema.  Eyes:     Extraocular Movements: Extraocular movements intact.     Conjunctiva/sclera: Conjunctivae normal.     Pupils: Pupils are equal, round, and reactive to light.  Cardiovascular:     Rate and Rhythm: Normal rate and regular rhythm.     Pulses: Normal pulses.     Heart sounds: Normal heart sounds.  Pulmonary:     Effort: Pulmonary effort is normal. No respiratory distress.     Breath sounds: Normal breath sounds. No wheezing.  Abdominal:     General: Bowel sounds are normal.     Tenderness: There is no abdominal tenderness.  Musculoskeletal:        General: Normal range of motion.     Cervical back: Normal range of motion.  Skin:    Findings: Rash  present.     Comments: Erythematous, maculopapular rash in linear fashion to bilateral forearms, upper back  Neurological:     General: No focal deficit present.     Mental Status: He is alert and oriented to person, place, and time.     Cranial Nerves: No cranial nerve deficit or facial asymmetry.     Sensory: Sensation is intact.     Motor: Motor function is intact. No weakness.     Coordination: Coordination is intact.     Gait: Gait is intact.     UC Treatments / Results  Labs (all labs ordered are listed, but only abnormal results are displayed) Labs Reviewed - No data to display  EKG  Radiology No results found.  Procedures Procedures   Medications Ordered in UC Medications  methylPREDNISolone sodium succinate (SOLU-MEDROL) 125 mg/2 mL injection 60 mg (60 mg Intramuscular Given 11/05/21 1150)    Initial Impression / Assessment and Plan / UC Course  I have reviewed the triage vital signs and the nursing notes.  Pertinent labs & imaging results that were available during my care of the patient were reviewed by me and considered in my medical decision making (see chart for details).  Blood pressure mildly elevated in clinic today.  Patient has history of this and was prescribed amlodipine in 2022. He has not been taking his medication consistently. Asymptomatic today with no headache, dizziness, vision changes, blurry or double vision, chest pain/tightness, shortness of breath, abdominal pain, weakness/one sided weakness. I recommend he take his medicine as prescribed and follow up with his primary care provider. Overall exam is reassuring and I believe reaction is limited to the skin.  Dose of Solu-Medrol given in clinic today.  Patient has improvement in eye swelling and itching.  We will do 40 mg prednisone daily for the next 3 days, starting tomorrow.  Return precautions were discussed.  Patient understands to go to the emergency department if symptoms worsen or if he  develops shortness of breath, tongue or lip swelling.  Patient agrees to plan and is discharged in stable condition.  Final Clinical Impressions(s) / UC Diagnoses   Final diagnoses:  Poison ivy  Facial swelling     Discharge Instructions      Please take the prednisone as prescribed. Take four (4) pills  once daily for the next three (3) days.   If your symptoms do not improve please return to the urgent care or go to the emergency department.    ED Prescriptions     Medication Sig Dispense Auth. Provider   predniSONE (DELTASONE) 10 MG tablet Take 4 tablets (40 mg total) by mouth daily with breakfast for 3 days. 12 tablet Danniela Mcbrearty, Wells Guiles, PA-C      PDMP not reviewed this encounter.   Jasn Xia, Vernice Jefferson 11/05/21 1307

## 2021-11-05 NOTE — ED Triage Notes (Signed)
Pt reports building a fence and got into poison ivy on Friday. Rash spreading and not relieved with ointment. Reports swelling to bilat eyes.

## 2022-07-15 ENCOUNTER — Ambulatory Visit: Payer: Medicare Other | Admitting: Family Medicine

## 2022-07-16 ENCOUNTER — Ambulatory Visit: Payer: Medicare Other | Admitting: Family Medicine

## 2022-07-17 ENCOUNTER — Ambulatory Visit: Payer: Medicare Other | Admitting: Family Medicine

## 2022-12-27 ENCOUNTER — Ambulatory Visit: Payer: 59 | Admitting: Podiatry

## 2022-12-27 DIAGNOSIS — B353 Tinea pedis: Secondary | ICD-10-CM

## 2022-12-27 DIAGNOSIS — B351 Tinea unguium: Secondary | ICD-10-CM

## 2022-12-27 MED ORDER — CLOTRIMAZOLE-BETAMETHASONE 1-0.05 % EX CREA: 1.0000 | TOPICAL_CREAM | Freq: Every day | CUTANEOUS | 0 refills | Status: AC

## 2022-12-27 MED ORDER — CICLOPIROX 8 % EX SOLN: Freq: Every day | CUTANEOUS | 0 refills | Status: AC

## 2022-12-27 NOTE — Progress Notes (Signed)
   HPI: 62 year old male presenting today as a new complaint of bilateral athlete's foot for which she would like a refill on Lotrisone cream.  He also has complaint of bilateral hallux onychomycosis/nail fungus he would like a topical medication denies any other acute complaints.   Past Medical History:  Diagnosis Date   Asthma      Physical Exam: General: The patient is alert and oriented x3 in no acute distress.  Dermatology: Pruritus to bilateral feet with hyperkeratosis. Skin is warm, dry and supple bilateral lower extremities. Negative for open lesions or macerations.  Vascular: Palpable pedal pulses bilaterally. No edema or erythema noted. Capillary refill within normal limits.  Neurological: Epicritic and protective threshold grossly intact bilaterally.   Musculoskeletal Exam: Range of motion within normal limits to all pedal and ankle joints bilateral. Muscle strength 5/5 in all groups bilateral.    Assessment: - tinea pedis bilateral -Onychomycosis bilateral hallux x 2   Plan of Care:  - Patient evaluated.  - Prescription for Lotrisone cream provided to patient.  - Prescription for Penlac for the nail fungus. - Recommended good foot hygiene with good shoe gear.  - Return to clinic as needed

## 2023-05-01 ENCOUNTER — Encounter: Payer: Self-pay | Admitting: Podiatry

## 2023-05-01 ENCOUNTER — Ambulatory Visit (INDEPENDENT_AMBULATORY_CARE_PROVIDER_SITE_OTHER): Payer: 59

## 2023-05-01 ENCOUNTER — Ambulatory Visit: Payer: 59 | Admitting: Podiatry

## 2023-05-01 DIAGNOSIS — M79671 Pain in right foot: Secondary | ICD-10-CM

## 2023-05-01 DIAGNOSIS — M7751 Other enthesopathy of right foot: Secondary | ICD-10-CM

## 2023-05-01 MED ORDER — TRIAMCINOLONE ACETONIDE 10 MG/ML IJ SUSP
10.0000 mg | Freq: Once | INTRAMUSCULAR | Status: AC
  Administered 2023-05-01: 10 mg via INTRA_ARTICULAR

## 2023-05-01 NOTE — Progress Notes (Signed)
Subjective:   Patient ID: [name], male   DOB: 62 y.o.   MRN: [id number]   HPI Patient presents with a lot of forefoot pain right states that it has been inflamed and sore with moderate obesity   ROS      Objective:  Physical Exam  Neurovascular status intact inflammation around the third MPJ right fluid buildup with pain     Assessment:  Inflammatory capsulitis third MPJ right     Plan:  H&P x-ray reviewed did a forefoot block right aspirated the third MPJ getting out a small amount of clear fluid injected quarter cc dexamethasone Kenalog and advised on padding therapy.  Reappoint to recheck  X-rays were negative for signs of fracture appears to be inflammatory

## 2023-05-16 ENCOUNTER — Encounter (HOSPITAL_COMMUNITY): Payer: Self-pay

## 2023-05-16 ENCOUNTER — Ambulatory Visit (HOSPITAL_COMMUNITY)
Admission: EM | Admit: 2023-05-16 | Discharge: 2023-05-16 | Disposition: A | Discharge status: Home / Self Care | Payer: 59 | Attending: Neurology | Admitting: Neurology

## 2023-05-16 DIAGNOSIS — J069 Acute upper respiratory infection, unspecified: Secondary | ICD-10-CM | POA: Diagnosis not present

## 2023-05-16 MED ORDER — BENZONATATE 100 MG PO CAPS: 100.0000 mg | ORAL_CAPSULE | Freq: Three times a day (TID) | ORAL | 0 refills | Status: AC

## 2023-05-16 MED ORDER — PROMETHAZINE-DM 6.25-15 MG/5ML PO SYRP: 2.5000 mL | ORAL_SOLUTION | Freq: Four times a day (QID) | ORAL | 0 refills | Status: DC | PRN

## 2023-05-16 NOTE — Discharge Instructions (Addendum)
You have a viral cough which will improve on its own with rest, fluids, and medications to help with your symptoms. We discussed prescriptions that may help with your symptoms: Tessalon pearls, promethazine DC You may use over the counter medicines as needed: tylenol/motrin, mucinex, zyrtec, Flonase Two teaspoons of honey in 1 cup of warm water every 4-6 hours may help with throat pains. Humidifier in room at nighttime may help soothe cough (clean well daily).   For chest pain, shortness of breath, inability to keep food or fluids down without vomiting, fever that does not respond to tylenol or motrin, or any other severe symptoms, please go to the ER for further evaluation. Return to urgent care as needed, otherwise follow-up with PCP.

## 2023-05-16 NOTE — ED Triage Notes (Signed)
Patient here today with c/o productive cough, nasal congestion, and wheeze X 1 week. He has been taking Robitussin, cough medicine, and cough drops with some relief. No sick contacts.

## 2023-05-16 NOTE — ED Provider Notes (Signed)
[facility]    CSN: [id number] Arrival date & time: 05/16/23  0808      History   Chief Complaint Chief Complaint  Patient presents with   Cough    HPI [name] is a 62 y.o. male.   Presents today with one week of cough and cold with mucus. He is currently using robitussin, mucinex, cold and cough plus. His cough worse at night when going to sleep. He denies fevers and recent sick contacts. He has not missed any of his medications recently. Denies leg pain or swelling. He does not feel like the medications he has been taking have helped enough. He endorses productive cough with grey/green/white mucus. He denies headaches, sinus pain, throat pain at this time.     The history is provided by the patient.  Cough Cough characteristics:  Productive Sputum characteristics:  Wallace Cullens, white and green Severity:  Moderate Duration:  1 week   Past Medical History:  Diagnosis Date   Asthma     Patient Active Problem List   Diagnosis Date Noted   Morbid obesity (HCC) 03/02/2021   Anemia associated with acute blood loss 03/22/2017    Class: Acute   Status post total replacement of right hip 03/20/2017   Traumatic arthritis of right hip 02/25/2017   Pain in right hip 02/25/2017    Past Surgical History:  Procedure Laterality Date   HIP SURGERY     TOTAL HIP ARTHROPLASTY Right 03/20/2017   Procedure: RIGHT TOTAL HIP ARTHROPLASTY ANTERIOR APPROACH;  Surgeon: Kathryne Hitch, MD;  [facility];  Service: Orthopedics;  Laterality: Right;       Home Medications    Prior to Admission medications   Medication Sig Start Date End Date Taking? Authorizing Provider  amLODipine (NORVASC) 5 MG tablet TAKE 1 TABLET(5 MG) BY MOUTH DAILY 07/04/21   [provider]  ciclopirox (PENLAC) 8 % solution Apply topically at bedtime. Apply over nail and surrounding skin. Apply daily over previous coat. After seven (7) days, may remove with alcohol and continue  cycle. 12/27/22   Candelaria Stagers, DPM  clotrimazole-betamethasone (LOTRISONE) cream Apply 1 application topically 2 (two) times daily. 08/25/17   Felecia Shelling, DPM  clotrimazole-betamethasone (LOTRISONE) cream Apply 1 Application topically daily. 12/27/22   Candelaria Stagers, DPM  tamsulosin (FLOMAX) 0.4 MG CAPS capsule Take by mouth. 02/21/21   [provider]    Family History History reviewed. No pertinent family history.  Social History Social History   Tobacco Use   Smoking status: Never   Smokeless tobacco: Never  Vaping Use   Vaping status: Never Used  Substance Use Topics   Alcohol use: Yes    Alcohol/week: 3.0 standard drinks of alcohol    Types: 3 Shots of liquor per week    Comment: social   Drug use: No     Allergies   Aspirin   Review of Systems Review of Systems  Respiratory:  Positive for cough.      Physical Exam Triage Vital Signs ED Triage Vitals  Encounter Vitals Group     BP 05/16/23 0902 (!) 156/92     Systolic BP Percentile --      Diastolic BP Percentile --      Pulse Rate 05/16/23 0902 (!) 57     Resp 05/16/23 0902 16     Temp 05/16/23 0902 98.2 F (36.8 C)     Temp Source 05/16/23 0902 Oral     SpO2  05/16/23 0902 94 %     Weight 05/16/23 0902 260 lb (117.9 kg)     Height 05/16/23 0902 5\' 7"  (1.702 m)     Head Circumference --      Peak Flow --      Pain Score 05/16/23 0900 0     Pain Loc --      Pain Education --      Exclude from Growth Chart --    No data found.  Updated Vital Signs BP (!) 156/92 (BP Location: Left Arm)   Pulse (!) 57   Temp 98.2 F (36.8 C) (Oral)   Resp 16   Ht 5\' 7"  (1.702 m)   Wt 260 lb (117.9 kg)   SpO2 94%   BMI 40.72 kg/m   Visual Acuity Right Eye Distance:   Left Eye Distance:   Bilateral Distance:    Right Eye Near:   Left Eye Near:    Bilateral Near:     Physical Exam Constitutional:      Appearance: He is obese.  HENT:     Head: Normocephalic.     Right Ear: Tympanic membrane  normal.     Left Ear: Tympanic membrane normal.     Nose: Nose normal.     Mouth/Throat:     Mouth: Mucous membranes are moist.  Eyes:     Extraocular Movements: Extraocular movements intact.     Pupils: Pupils are equal, round, and reactive to light.  Cardiovascular:     Rate and Rhythm: Normal rate.  Pulmonary:     Effort: Pulmonary effort is normal.     Breath sounds: Normal breath sounds.     Comments: Productive cough Abdominal:     General: Abdomen is flat.  Musculoskeletal:        General: Normal range of motion.     Cervical back: Normal range of motion.  Skin:    General: Skin is warm and dry.  Neurological:     General: No focal deficit present.     Mental Status: He is alert. Mental status is at baseline.  Psychiatric:        Mood and Affect: Mood normal.        Thought Content: Thought content normal.      UC Treatments / Results  Labs (all labs ordered are listed, but only abnormal results are displayed) Labs Reviewed - No data to display  EKG   Radiology No results found.  Procedures Procedures (including critical care time)  Medications Ordered in UC Medications - No data to display  Initial Impression / Assessment and Plan / UC Course  I have reviewed the triage vital signs and the nursing notes.  Pertinent labs & imaging results that were available during my care of the patient were reviewed by me and considered in my medical decision making (see chart for details).   Suspect viral URI, viral syndrome. Physical exam findings reassuring, vital signs hemodynamically stable. Low suspicion for pneumonia/acute cardiopulmonary abnormality, therefore deferred imaging of the chest. Advised supportive care, offered prescriptions for symptomatic relief.  Recommend continued use of OTC medications as needed, recommendations discussed with patient/caregiver and outlined in AVS.  Strep/viral testing:  deferred due to no throat pain, redness or  exudate      Final Clinical Impressions(s) / UC Diagnoses   Final diagnoses:  None   Discharge Instructions   None    ED Prescriptions   None    PDMP not reviewed this encounter.  Elmer Picker, NP 05/16/23 1012

## 2023-06-24 ENCOUNTER — Other Ambulatory Visit: Payer: Self-pay | Admitting: Family Medicine

## 2023-06-24 ENCOUNTER — Ambulatory Visit: Payer: Self-pay | Admitting: General Practice

## 2023-06-24 NOTE — Telephone Encounter (Signed)
Copied from CRM [id number]. Topic: Clinical - Medication Refill >> Jun 24, 2023  2:24 PM Gildardo Pounds wrote: Most Recent Primary Care Visit:   Medication: amLODipine (NORVASC) 5 MG tablet  Has the patient contacted their pharmacy? Yes (Agent: If no, request that the patient contact the pharmacy for the refill. If patient does not wish to contact the pharmacy document the reason why and proceed with request.) (Agent: If yes, when and what did the pharmacy advise?)  Is this the correct pharmacy for this prescription? Yes If no, delete pharmacy and type the correct one.  This is the patient's preferred pharmacy:  [facility] [id number] - [place], [place] - [address] AT [facility] [address] [place] [address] Phone: [phone] Fax: [phone]   Has the prescription been filled recently? No  Is the patient out of the medication? Yes  Has the patient been seen for an appointment in the last year OR does the patient have an upcoming appointment? Yes  Can we respond through MyChart? No  Agent: Please be advised that Rx refills may take up to 3 business days. We ask that you follow-up with your pharmacy.

## 2023-06-24 NOTE — Telephone Encounter (Signed)
   Copied From CRM [id number]. Reason for Triage: Dreama NP with [facility] said patient's readings are 150-90 on blood pressure with intermittent headaches. Patient's callback number is [phone]   Chief Complaint: Elevated BP Symptoms: Intermittent headache Frequency: This morning Pertinent Negatives: Patient denies blurred vision, chest pain, difficulty breathing, weakness Disposition: [] ED /[] Urgent Care (no appt availability in office) / [x] Appointment(In office/virtual)/ []  [facility] [facility]/ [] Home Care/ [] Refused Recommended Disposition /[] [place] [facility]/ []  Follow-up with PCP Additional Notes: Patient called in to report elevated BP. Patient stated that a home health nurse checked his BP this morning and his systolic pressure was in the 150s. Patient is not experiencing symptoms at this time. Patient stated that he does have a headache that comes and goes. Patient denied blurred vision, chest pain, difficulty breathing, and weakness. Patient is looking to establish a PCP in addition to having his high BP assessed. Advised patient to see a provider within 2 weeks. Scheduled patient at [facility]. Advised patient to call back if symptoms worsen. Advised patient when it is appropriate to go to the ED. Patient complied.   Reason for Disposition  [1] Systolic BP  >= 130 OR Diastolic >= 80 AND [2] not taking BP medications  Answer Assessment - Initial Assessment Questions 1. BLOOD PRESSURE: "What is the blood pressure?" "Did you take at least two measurements 5 minutes apart?"     Systolic in 150s this morning  2. ONSET: "When did you take your blood pressure?"     This morning  3. HOW: "How did you take your blood pressure?" (e.g., automatic home BP monitor, visiting nurse)     Taken by home health RN  4. HISTORY: "Do you have a history of high blood pressure?"     Denies  5. MEDICINES: "Are you taking any medicines for blood pressure?" "Have you missed any doses  recently?"     Denies  6. OTHER SYMPTOMS: "Do you have any symptoms?" (e.g., blurred vision, chest pain, difficulty breathing, headache, weakness)     Headache that comes and goes, states no headache at this time, denies blurred vision, denies chest pain, denies difficulty breathing, denies weakness  Protocols used: Blood Pressure - High-A-AH

## 2023-07-04 NOTE — Progress Notes (Deleted)
 New patient visit   Patient: [name]   DOB: 07-Jul-1960   63 y.o. Male  MRN: [id number] Visit Date: 07/07/2023  Today's healthcare provider: Alfredia Ferguson, PA-C   No chief complaint on file.  Subjective    [name] is a 63 y.o. male who presents today as a new patient to establish care.   ***  Past Medical History:  Diagnosis Date   Asthma    Past Surgical History:  Procedure Laterality Date   HIP SURGERY     TOTAL HIP ARTHROPLASTY Right 03/20/2017   Procedure: RIGHT TOTAL HIP ARTHROPLASTY ANTERIOR APPROACH;  Surgeon: Kathryne Hitch, MD;  [facility];  Service: Orthopedics;  Laterality: Right;   No family status information on file.   No family history on file. Social History   Socioeconomic History   Marital status: Divorced    Spouse name: Not on file   Number of children: Not on file   Years of education: Not on file   Highest education level: Not on file  Occupational History   Not on file  Tobacco Use   Smoking status: Never   Smokeless tobacco: Never  Vaping Use   Vaping status: Never Used  Substance and Sexual Activity   Alcohol use: Yes    Alcohol/week: 3.0 standard drinks of alcohol    Types: 3 Shots of liquor per week    Comment: social   Drug use: No   Sexual activity: Not on file  Other Topics Concern   Not on file  Social History Narrative   ** Merged History Encounter **       Social Drivers of Health   Financial Resource Strain: Low Risk  (03/02/2021)   Received from [employer], [facility]   Overall Financial Resource Strain (CARDIA)    Difficulty of Paying Living Expenses: Not hard at all  Food Insecurity: No Food Insecurity (03/02/2021)   Received from [facility], [facility]   Hunger Vital Sign    Worried About Running Out of Food in the Last Year: Never true    Ran Out of Food in the Last Year: Never true  Transportation Needs: No Transportation Needs (03/02/2021)   Received from [employer], [facility]   PRAPARE - Transportation    Lack of Transportation (Medical): No    Lack of Transportation (Non-Medical): No  Physical Activity: Inactive (03/02/2021)   Received from [facility], [facility]   Exercise Vital Sign    Days of Exercise per Week: 0 days    Minutes of Exercise per Session: 0 min  Stress: No Stress Concern Present (03/02/2021)   Received from [facility], [facility] - Occupational Stress Questionnaire    Feeling of Stress : Only a little  Social Connections: Unknown (10/09/2021)   Received from [facility], [facility]   Social Network    Social Network: Not on file   Outpatient Medications Prior to Visit  Medication Sig   amLODipine (NORVASC) 5 MG tablet TAKE 1 TABLET(5 MG) BY MOUTH DAILY   benzonatate (TESSALON) 100 MG capsule Take 1 capsule (100 mg total) by mouth every 8 (eight) hours.   ciclopirox (PENLAC) 8 % solution Apply topically at bedtime. Apply over nail and surrounding skin. Apply daily over previous coat. After seven (7) days, may remove with alcohol and continue cycle.   clotrimazole-betamethasone (LOTRISONE) cream Apply 1 application topically 2 (two) times daily.   clotrimazole-betamethasone (  LOTRISONE) cream Apply 1 Application topically daily.   promethazine-dextromethorphan (PROMETHAZINE-DM) 6.25-15 MG/5ML syrup Take 2.5 mLs by mouth 4 (four) times daily as needed for cough.   tamsulosin (FLOMAX) 0.4 MG CAPS capsule Take by mouth.   No facility-administered medications prior to visit.   Allergies  Allergen Reactions   Aspirin Nausea Only     There is no immunization history on file for this patient.  Health Maintenance  Topic Date Due   HIV Screening  Never done   Hepatitis C Screening  Never done   DTaP/Tdap/Td (1 - Tdap) Never done   Colonoscopy  Never done   Zoster Vaccines- Shingrix (1 of 2) Never done   Medicare Annual Wellness (AWV)  03/02/2022   INFLUENZA  VACCINE  Never done   COVID-19 Vaccine (1 - 2024-25 season) Never done   HPV VACCINES  Aged Out    Patient Care Team: Patient, No Pcp Per as PCP - General (General Practice) Pcp, No  Review of Systems  {Insert previous labs (optional):23779} {See past labs  Heme  Chem  Endocrine  Serology  Results Review (optional):1}   Objective    There were no vitals taken for this visit. {Insert last BP/Wt (optional):23777}{See vitals history (optional):1}   Physical Exam ***  Depression Screen     No data to display         No results found for any visits on 07/07/23.  Assessment & Plan     There are no diagnoses linked to this encounter.   No follow-ups on file.      Alfredia Ferguson, PA-C  [facility] [phone] (phone) [phone] (fax)  [facility]

## 2023-07-07 ENCOUNTER — Ambulatory Visit: Payer: 59 | Admitting: Physician Assistant

## 2023-07-30 ENCOUNTER — Ambulatory Visit: Payer: 59 | Admitting: Internal Medicine

## 2023-09-03 ENCOUNTER — Ambulatory Visit: Payer: 59 | Admitting: Family Medicine

## 2023-10-09 ENCOUNTER — Encounter (HOSPITAL_COMMUNITY): Payer: Self-pay | Admitting: *Deleted

## 2023-10-09 ENCOUNTER — Other Ambulatory Visit: Payer: Self-pay

## 2023-10-09 ENCOUNTER — Ambulatory Visit (HOSPITAL_COMMUNITY)
Admission: EM | Admit: 2023-10-09 | Discharge: 2023-10-09 | Disposition: A | Discharge status: Home / Self Care | Payer: Self-pay | Attending: Emergency Medicine | Admitting: Emergency Medicine

## 2023-10-09 DIAGNOSIS — M545 Low back pain, unspecified: Secondary | ICD-10-CM

## 2023-10-09 DIAGNOSIS — M25521 Pain in right elbow: Secondary | ICD-10-CM

## 2023-10-09 DIAGNOSIS — M25561 Pain in right knee: Secondary | ICD-10-CM

## 2023-10-09 MED ORDER — BACLOFEN 10 MG PO TABS: 10.0000 mg | ORAL_TABLET | Freq: Three times a day (TID) | ORAL | 0 refills | Status: AC

## 2023-10-09 MED ORDER — LIDOCAINE 5 % EX PTCH: 1.0000 | MEDICATED_PATCH | CUTANEOUS | 0 refills | Status: AC

## 2023-10-09 NOTE — Discharge Instructions (Signed)
 You can take baclofen 3 times daily as needed for muscle pain and spasms. Apply lidocaine  for 12 hours at a time once daily to help with pain as well. Otherwise you can take 650 mg of Tylenol  every 6-8 hours as needed for pain. Alternate between ice and heat as needed for pain. Stay moving and do some gentle stretching to avoid becoming stiff. Follow-up with [facility] if pain continues. Follow-up with primary care doctor or return here if needed.
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
## 2023-10-09 NOTE — ED Triage Notes (Signed)
 DOB and name confirmed

## 2023-10-10 ENCOUNTER — Emergency Department (HOSPITAL_COMMUNITY)
Admission: EM | Admit: 2023-10-10 | Discharge: 2023-10-10 | Disposition: A | Discharge status: Home / Self Care | Attending: Emergency Medicine | Admitting: Emergency Medicine

## 2023-10-10 ENCOUNTER — Emergency Department (HOSPITAL_COMMUNITY)

## 2023-10-10 DIAGNOSIS — J45909 Unspecified asthma, uncomplicated: Secondary | ICD-10-CM | POA: Insufficient documentation

## 2023-10-10 DIAGNOSIS — Y92410 Unspecified street and highway as the place of occurrence of the external cause: Secondary | ICD-10-CM | POA: Diagnosis not present

## 2023-10-10 DIAGNOSIS — S8991XA Unspecified injury of right lower leg, initial encounter: Secondary | ICD-10-CM | POA: Insufficient documentation

## 2023-10-10 DIAGNOSIS — S59901A Unspecified injury of right elbow, initial encounter: Secondary | ICD-10-CM | POA: Insufficient documentation

## 2023-10-10 DIAGNOSIS — Z96641 Presence of right artificial hip joint: Secondary | ICD-10-CM | POA: Insufficient documentation

## 2023-10-10 MED ORDER — ACETAMINOPHEN 500 MG PO TABS
1000.0000 mg | ORAL_TABLET | Freq: Once | ORAL | Status: AC
  Administered 2023-10-10: 1000 mg via ORAL
  Filled 2023-10-10: qty 2
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
